# Patient Record
Sex: Female | Born: 1995 | Race: White | Hispanic: No | Marital: Single | Smoking: Former smoker
Health system: Southern US, Community
[De-identification: ages and names within clinical notes are randomized; demographics above are authoritative.]

## PROBLEM LIST (undated history)

## (undated) DIAGNOSIS — N83209 Unspecified ovarian cyst, unspecified side: Secondary | ICD-10-CM

## (undated) DIAGNOSIS — S72009A Fracture of unspecified part of neck of unspecified femur, initial encounter for closed fracture: Secondary | ICD-10-CM

---
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
## 2016-04-16 ENCOUNTER — Encounter: Payer: Self-pay | Admitting: Pediatrics

## 2016-04-17 ENCOUNTER — Encounter: Payer: Self-pay | Admitting: Pediatrics

## 2017-05-27 ENCOUNTER — Emergency Department (HOSPITAL_COMMUNITY)
Admission: EM | Admit: 2017-05-27 | Discharge: 2017-05-27 | Disposition: A | Discharge status: Home / Self Care | Payer: Self-pay | Attending: Emergency Medicine | Admitting: Emergency Medicine

## 2017-05-27 ENCOUNTER — Encounter (HOSPITAL_COMMUNITY): Payer: Self-pay | Admitting: Emergency Medicine

## 2017-05-27 DIAGNOSIS — J029 Acute pharyngitis, unspecified: Secondary | ICD-10-CM | POA: Insufficient documentation

## 2017-05-27 DIAGNOSIS — F17210 Nicotine dependence, cigarettes, uncomplicated: Secondary | ICD-10-CM | POA: Insufficient documentation

## 2017-05-27 LAB — CBC WITH DIFFERENTIAL/PLATELET
BASOS ABS: 0 10*3/uL (ref 0.0–0.1)
BASOS PCT: 1 %
EOS ABS: 0.2 10*3/uL (ref 0.0–0.7)
EOS PCT: 3 %
HCT: 35.6 % — ABNORMAL LOW (ref 36.0–46.0)
Hemoglobin: 12.1 g/dL (ref 12.0–15.0)
Lymphocytes Relative: 46 %
Lymphs Abs: 2.9 10*3/uL (ref 0.7–4.0)
MCH: 30.9 pg (ref 26.0–34.0)
MCHC: 34 g/dL (ref 30.0–36.0)
MCV: 90.8 fL (ref 78.0–100.0)
MONO ABS: 0.5 10*3/uL (ref 0.1–1.0)
MONOS PCT: 8 %
Neutro Abs: 2.6 10*3/uL (ref 1.7–7.7)
Neutrophils Relative %: 42 %
PLATELETS: 324 10*3/uL (ref 150–400)
RBC: 3.92 MIL/uL (ref 3.87–5.11)
RDW: 12.1 % (ref 11.5–15.5)
WBC: 6.3 10*3/uL (ref 4.0–10.5)

## 2017-05-27 LAB — MONONUCLEOSIS SCREEN: MONO SCREEN: NEGATIVE

## 2017-05-27 LAB — RAPID STREP SCREEN (MED CTR MEBANE ONLY): STREPTOCOCCUS, GROUP A SCREEN (DIRECT): NEGATIVE

## 2017-05-27 MED ORDER — AMOXICILLIN 500 MG PO CAPS: 500.0000 mg | ORAL_CAPSULE | Freq: Three times a day (TID) | ORAL | 0 refills | Status: DC

## 2017-05-27 MED ORDER — IBUPROFEN 600 MG PO TABS: 600.0000 mg | ORAL_TABLET | Freq: Four times a day (QID) | ORAL | 0 refills | Status: DC | PRN

## 2017-05-27 MED ORDER — ACETAMINOPHEN 325 MG PO TABS
650.0000 mg | ORAL_TABLET | Freq: Once | ORAL | Status: AC | PRN
  Administered 2017-05-27: 650 mg via ORAL
  Filled 2017-05-27: qty 2

## 2017-05-27 NOTE — ED Provider Notes (Signed)
[facility]-EMERGENCY DEPT Provider Note   CSN: [id number] Arrival date & time: 05/27/17  [id number]     History   Chief Complaint Chief Complaint  Patient presents with  . Sore Throat    HPI Christine Torres is a 21 y.o. female.  The history is provided by the patient. No language interpreter was used.  Sore Throat  This is a new problem. Episode onset: 6 weeks. The problem occurs constantly. The problem has not changed since onset.Pertinent negatives include no headaches. Nothing aggravates the symptoms. Nothing relieves the symptoms. She has tried nothing for the symptoms. The treatment provided no relief.  Pt has had a sore throat for over 3 weeks.  Pt also had similar about 6 weeks ago.    History reviewed. No pertinent past medical history.  Patient Active Problem List   Diagnosis Date Noted  . Well child check 06/24/2013  . Amenorrhea 06/24/2013  . Screening 06/24/2013    History reviewed. No pertinent surgical history.  OB History    No data available       Home Medications    Prior to Admission medications   Medication Sig Start Date End Date Taking? Authorizing Provider  amoxicillin (AMOXIL) 500 MG capsule Take 1 capsule (500 mg total) by mouth 3 (three) times daily. 05/27/17   Elson AreasSofia, Lakasha Mcfall K, PA-C  ibuprofen (ADVIL,MOTRIN) 600 MG tablet Take 1 tablet (600 mg total) by mouth every 6 (six) hours as needed. 05/27/17   Elson AreasSofia, Taneah Masri K, PA-C    Family History Family History  Problem Relation Age of Onset  . Mental illness Father   . Arthritis Maternal Grandfather   . Hypertension Paternal Grandmother   . Diabetes Paternal Grandmother   . Alcohol abuse Paternal Grandfather   . Asthma Neg Hx   . Birth defects Neg Hx   . Cancer Neg Hx   . COPD Neg Hx   . Depression Neg Hx   . Drug abuse Neg Hx   . Early death Neg Hx   . Hearing loss Neg Hx   . Heart disease Neg Hx   . Hyperlipidemia Neg Hx   . Kidney disease Neg Hx   . Learning disabilities Neg Hx   . Mental  retardation Neg Hx   . Miscarriages / Stillbirths Neg Hx   . Stroke Neg Hx   . Vision loss Neg Hx     Social History Social History  Substance Use Topics  . Smoking status: Current Every Day Smoker    Types: Cigarettes  . Smokeless tobacco: Never Used  . Alcohol use No     Allergies   Patient has no known allergies.   Review of Systems Review of Systems  Neurological: Negative for headaches.  All other systems reviewed and are negative.    Physical Exam Updated Vital Signs BP 107/67   Pulse 71   Temp 98 F (36.7 C) (Oral)   Resp 12   Ht 5\' 3"  (1.6 m)   Wt 54.4 kg (120 lb)   SpO2 100%   BMI 21.26 kg/m   Physical Exam  Constitutional: She appears well-developed and well-nourished.  HENT:  Head: Normocephalic.  Right Ear: External ear normal.  Left Ear: External ear normal.  Erythema pharynx.    Eyes: Pupils are equal, round, and reactive to light. EOM are normal.  Neck: Normal range of motion.  Cardiovascular: Normal rate.   Pulmonary/Chest: Effort normal.  Abdominal: Soft.  Musculoskeletal: Normal range of motion.  Neurological: She is  alert.  Skin: Skin is warm.  Psychiatric: She has a normal mood and affect.  Nursing note and vitals reviewed.    ED Treatments / Results  Labs (all labs ordered are listed, but only abnormal results are displayed) Labs Reviewed  CBC WITH DIFFERENTIAL/PLATELET - Abnormal; Notable for the following:       Result Value   HCT 35.6 (*)    All other components within normal limits  RAPID STREP SCREEN (NOT AT [facility])  CULTURE, GROUP A STREP [facility])  MONONUCLEOSIS SCREEN    EKG  EKG Interpretation None       Radiology No results found.  Procedures Procedures (including critical care time)  Medications Ordered in ED Medications  acetaminophen (TYLENOL) tablet 650 mg (650 mg Oral Given 05/27/17 0959)     Initial Impression / Assessment and Plan / ED Course  I have reviewed the triage vital signs and the  nursing notes.  Pertinent labs & imaging results that were available during my care of the patient were reviewed by me and considered in my medical decision making (see chart for details).     Current Meds  Medication Sig  . [DISCONTINUED] ibuprofen (ADVIL,MOTRIN) 200 MG tablet Take 400-600 mg by mouth every 6 (six) hours as needed for moderate pain.   An After Visit Summary was printed and given to the patient.   Final Clinical Impressions(s) / ED Diagnoses   Final diagnoses:  Pharyngitis, unspecified etiology    New Prescriptions New Prescriptions   AMOXICILLIN (AMOXIL) 500 MG CAPSULE    Take 1 capsule (500 mg total) by mouth 3 (three) times daily.   IBUPROFEN (ADVIL,MOTRIN) 600 MG TABLET    Take 1 tablet (600 mg total) by mouth every 6 (six) hours as needed.     Elson Areas, PA-C 05/27/17 1029    Shaune Pollack, MD 05/28/17 1122

## 2017-05-27 NOTE — ED Triage Notes (Signed)
Patient complaining of swollen lymph nodes in her throat. Patient went to md about three weeks ago and tested for strep. Patient did not have strep. Patient states now she has white bumps in the back of her throat.

## 2017-05-29 LAB — CULTURE, GROUP A STREP (THRC)

## 2017-06-16 ENCOUNTER — Encounter (HOSPITAL_COMMUNITY): Payer: Self-pay | Admitting: Emergency Medicine

## 2017-06-16 ENCOUNTER — Emergency Department (HOSPITAL_COMMUNITY)
Admission: EM | Admit: 2017-06-16 | Discharge: 2017-06-16 | Discharge status: Against Medical Advice | Payer: Self-pay | Attending: Emergency Medicine | Admitting: Emergency Medicine

## 2017-06-16 DIAGNOSIS — Z5321 Procedure and treatment not carried out due to patient leaving prior to being seen by health care provider: Secondary | ICD-10-CM | POA: Insufficient documentation

## 2017-06-16 LAB — COMPREHENSIVE METABOLIC PANEL
ALK PHOS: 78 U/L (ref 38–126)
ALT: 15 U/L (ref 14–54)
ANION GAP: 10 (ref 5–15)
AST: 20 U/L (ref 15–41)
Albumin: 4.7 g/dL (ref 3.5–5.0)
BILIRUBIN TOTAL: 1 mg/dL (ref 0.3–1.2)
BUN: 16 mg/dL (ref 6–20)
CALCIUM: 10 mg/dL (ref 8.9–10.3)
CO2: 25 mmol/L (ref 22–32)
Chloride: 105 mmol/L (ref 101–111)
Creatinine, Ser: 0.96 mg/dL (ref 0.44–1.00)
Glucose, Bld: 108 mg/dL — ABNORMAL HIGH (ref 65–99)
Potassium: 4.1 mmol/L (ref 3.5–5.1)
SODIUM: 140 mmol/L (ref 135–145)
TOTAL PROTEIN: 8.1 g/dL (ref 6.5–8.1)

## 2017-06-16 LAB — CBC
HEMATOCRIT: 39.2 % (ref 36.0–46.0)
HEMOGLOBIN: 13.4 g/dL (ref 12.0–15.0)
MCH: 30.5 pg (ref 26.0–34.0)
MCHC: 34.2 g/dL (ref 30.0–36.0)
MCV: 89.3 fL (ref 78.0–100.0)
Platelets: 313 10*3/uL (ref 150–400)
RBC: 4.39 MIL/uL (ref 3.87–5.11)
RDW: 11.9 % (ref 11.5–15.5)
WBC: 11.3 10*3/uL — ABNORMAL HIGH (ref 4.0–10.5)

## 2017-06-16 LAB — LIPASE, BLOOD: LIPASE: 19 U/L (ref 11–51)

## 2017-06-16 MED ORDER — MORPHINE SULFATE (PF) 4 MG/ML IV SOLN: 4.0000 mg | Freq: Once | INTRAVENOUS | Status: DC

## 2017-06-16 MED ORDER — ONDANSETRON 4 MG PO TBDP
4.0000 mg | ORAL_TABLET | Freq: Once | ORAL | Status: AC | PRN
  Administered 2017-06-16: 4 mg via ORAL
  Filled 2017-06-16: qty 1

## 2017-06-16 NOTE — ED Triage Notes (Signed)
Patient c/o sore throat that feels swollen x 2 months and bilat ear pain x month. Last night patient started having vomiting but denies diarrhea. Patient reports being seen here and tested for MOno and other things but all came back negative.

## 2017-09-02 ENCOUNTER — Emergency Department (HOSPITAL_COMMUNITY)
Admission: EM | Admit: 2017-09-02 | Discharge: 2017-09-02 | Disposition: A | Discharge status: Home / Self Care | Payer: Self-pay | Attending: Physician Assistant | Admitting: Physician Assistant

## 2017-09-02 ENCOUNTER — Encounter (HOSPITAL_COMMUNITY): Payer: Self-pay | Admitting: *Deleted

## 2017-09-02 ENCOUNTER — Emergency Department (HOSPITAL_COMMUNITY): Payer: Self-pay

## 2017-09-02 DIAGNOSIS — R102 Pelvic and perineal pain: Secondary | ICD-10-CM | POA: Insufficient documentation

## 2017-09-02 DIAGNOSIS — M25551 Pain in right hip: Secondary | ICD-10-CM | POA: Insufficient documentation

## 2017-09-02 DIAGNOSIS — F17210 Nicotine dependence, cigarettes, uncomplicated: Secondary | ICD-10-CM | POA: Insufficient documentation

## 2017-09-02 DIAGNOSIS — M25552 Pain in left hip: Secondary | ICD-10-CM | POA: Insufficient documentation

## 2017-09-02 HISTORY — DX: Fracture of unspecified part of neck of unspecified femur, initial encounter for closed fracture: S72.009A

## 2017-09-02 LAB — COMPREHENSIVE METABOLIC PANEL
ALBUMIN: 4.7 g/dL (ref 3.5–5.0)
ALT: 18 U/L (ref 14–54)
ANION GAP: 9 (ref 5–15)
AST: 37 U/L (ref 15–41)
Alkaline Phosphatase: 71 U/L (ref 38–126)
BILIRUBIN TOTAL: 1.5 mg/dL — AB (ref 0.3–1.2)
BUN: 9 mg/dL (ref 6–20)
CHLORIDE: 104 mmol/L (ref 101–111)
CO2: 26 mmol/L (ref 22–32)
Calcium: 9.6 mg/dL (ref 8.9–10.3)
Creatinine, Ser: 0.89 mg/dL (ref 0.44–1.00)
GFR calc Af Amer: >60 mL/min (ref >60)
GFR calc non Af Amer: >60 mL/min (ref >60)
GLUCOSE: 85 mg/dL (ref 65–99)
Potassium: 4.1 mmol/L (ref 3.5–5.1)
SODIUM: 139 mmol/L (ref 135–145)
TOTAL PROTEIN: 8.5 g/dL — AB (ref 6.5–8.1)

## 2017-09-02 LAB — CBC WITH DIFFERENTIAL/PLATELET
BASOS ABS: 0.1 10*3/uL (ref 0.0–0.1)
Basophils Relative: 1 %
Eosinophils Absolute: 0.1 10*3/uL (ref 0.0–0.7)
Eosinophils Relative: 1 %
HEMATOCRIT: 40.3 % (ref 36.0–46.0)
Hemoglobin: 14 g/dL (ref 12.0–15.0)
LYMPHS ABS: 1.9 10*3/uL (ref 0.7–4.0)
LYMPHS PCT: 21 %
MCH: 31.8 pg (ref 26.0–34.0)
MCHC: 34.7 g/dL (ref 30.0–36.0)
MCV: 91.6 fL (ref 78.0–100.0)
MONO ABS: 0.5 10*3/uL (ref 0.1–1.0)
MONOS PCT: 6 %
NEUTROS ABS: 6.7 10*3/uL (ref 1.7–7.7)
Neutrophils Relative %: 71 %
PLATELETS: 321 10*3/uL (ref 150–400)
RBC: 4.4 MIL/uL (ref 3.87–5.11)
RDW: 11.8 % (ref 11.5–15.5)
WBC: 9.3 10*3/uL (ref 4.0–10.5)

## 2017-09-02 LAB — MONONUCLEOSIS SCREEN: MONO SCREEN: NEGATIVE

## 2017-09-02 LAB — HCG, SERUM, QUALITATIVE: Preg, Serum: NEGATIVE

## 2017-09-02 MED ORDER — SODIUM CHLORIDE 0.9 % IV BOLUS (SEPSIS)
1000.0000 mL | Freq: Once | INTRAVENOUS | Status: AC
  Administered 2017-09-02: 1000 mL via INTRAVENOUS

## 2017-09-02 MED ORDER — ACETAMINOPHEN 325 MG PO TABS
650.0000 mg | ORAL_TABLET | Freq: Once | ORAL | Status: AC
  Administered 2017-09-02: 650 mg via ORAL
  Filled 2017-09-02: qty 2

## 2017-09-02 NOTE — ED Triage Notes (Signed)
Pt complains of bilateral hip pain and back pain, generalized body aches for the past 2 days. Pt states the pain started after exercising. Pt has hx of bilateral hip fractures. Pt states she feels like she has had fever for past 24 hours.

## 2017-09-02 NOTE — ED Provider Notes (Signed)
[facility]-EMERGENCY DEPT Provider Note   CSN: [id number] Arrival date & time: 09/02/17  [id number]     History   Chief Complaint Chief Complaint  Patient presents with  . Hip Pain  . Back Pain    HPI Christine Torres is a 21 y.o. female.  HPI   Patient is a 21 year old female presenting today with multiple complaints.  She reports that she has had bilateral hip pain since doing intensive exercise 3-4 days ago.  She reports that she was initially in the [employer] several years ago and had a pelvic fracture.  She was discharged.  However she reports she was exercising in both of her hip started hurting.  She reports that last night she developed a fever.  She is got no focal complaints except feelings of fatigue and achiness in her hips and into her back.  Specifically she denies any shortness of breath, cough, nasal congestion, urinary complaints, dyspareunia, vaginal discharge.  Past Medical History:  Diagnosis Date  . Hip fracture [facility](HCC)     Patient Active Problem List   Diagnosis Date Noted  . Well child check 06/24/2013  . Amenorrhea 06/24/2013  . Screening 06/24/2013    History reviewed. No pertinent surgical history.  OB History    No data available       Home Medications    Prior to Admission medications   Medication Sig Start Date End Date Taking? Authorizing Provider  amoxicillin (AMOXIL) 500 MG capsule Take 1 capsule (500 mg total) by mouth 3 (three) times daily. Patient not taking: Reported on 09/02/2017 05/27/17   Elson AreasSofia, Leslie K, PA-C  ibuprofen (ADVIL,MOTRIN) 600 MG tablet Take 1 tablet (600 mg total) by mouth every 6 (six) hours as needed. Patient not taking: Reported on 09/02/2017 05/27/17   Osie CheeksSofia, Leslie K, PA-C    Family History Family History  Problem Relation Age of Onset  . Mental illness Father   . Arthritis Maternal Grandfather   . Hypertension Paternal Grandmother   . Diabetes Paternal Grandmother   . Alcohol abuse Paternal  Grandfather   . Asthma Neg Hx   . Birth defects Neg Hx   . Cancer Neg Hx   . COPD Neg Hx   . Depression Neg Hx   . Drug abuse Neg Hx   . Early death Neg Hx   . Hearing loss Neg Hx   . Heart disease Neg Hx   . Hyperlipidemia Neg Hx   . Kidney disease Neg Hx   . Learning disabilities Neg Hx   . Mental retardation Neg Hx   . Miscarriages / Stillbirths Neg Hx   . Stroke Neg Hx   . Vision loss Neg Hx     Social History Social History   Tobacco Use  . Smoking status: Current Every Day Smoker    Types: Cigarettes  . Smokeless tobacco: Never Used  Substance Use Topics  . Alcohol use: No  . Drug use: No     Allergies   Patient has no known allergies.   Review of Systems Review of Systems  Constitutional: Positive for fatigue and fever. Negative for activity change.  HENT: Negative for congestion and ear pain.   Respiratory: Negative for shortness of breath.   Cardiovascular: Negative for chest pain.  Gastrointestinal: Negative for abdominal pain.  Musculoskeletal: Positive for arthralgias and myalgias. Negative for joint swelling, neck pain and neck stiffness.  Skin: Negative for pallor.     Physical Exam Updated Vital Signs BP  121/78 (BP Location: Right Arm)   Pulse (!) 105   Temp 100.1 F (37.8 C) (Oral)   Resp 18   SpO2 98%   Physical Exam  Constitutional: She is oriented to person, place, and time. She appears well-developed and well-nourished.  HENT:  Head: Normocephalic and atraumatic.  Eyes: Right eye exhibits no discharge. Left eye exhibits no discharge.  Cardiovascular: Normal rate and regular rhythm.  Pulmonary/Chest: Effort normal and breath sounds normal. No respiratory distress.  Abdominal: Soft. She exhibits no distension. There is no tenderness.  Genitourinary:  Genitourinary Comments: Painful lymphadenopathy bilaterally groin.  Musculoskeletal:  No pain with rotation of bilateral lower extremities.  Full range of motion at hip and knee.  No  evidence of erythema, or external warmth bilaterally.  Neurological: She is oriented to person, place, and time.  Skin: Skin is warm and dry. She is not diaphoretic.  Psychiatric: She has a normal mood and affect.  Nursing note and vitals reviewed.    ED Treatments / Results  Labs (all labs ordered are listed, but only abnormal results are displayed) Labs Reviewed  COMPREHENSIVE METABOLIC PANEL - Abnormal; Notable for the following components:      Result Value   Total Protein 8.5 (*)    Total Bilirubin 1.5 (*)    All other components within normal limits  CBC WITH DIFFERENTIAL/PLATELET  HCG, SERUM, QUALITATIVE  MONONUCLEOSIS SCREEN  URINALYSIS, ROUTINE W REFLEX MICROSCOPIC    EKG  EKG Interpretation None       Radiology Dg Pelvis 1-2 Views  Result Date: 09/02/2017 CLINICAL DATA:  Bilateral hip pain and back pain over the last 2 days. EXAM: PELVIS - 1-2 VIEW COMPARISON:  11/07/2015. FINDINGS: No focal bone finding. No traumatic finding. No evidence of sacroiliac or hip arthropathy. IMPRESSION: Normal radiograph. Electronically Signed   By: Paulina Fusi M.D.   On: 09/02/2017 09:46    Procedures Procedures (including critical care time)  Medications Ordered in ED Medications  sodium chloride 0.9 % bolus 1,000 mL (1,000 mLs Intravenous New Bag/Given 09/02/17 0827)  acetaminophen (TYLENOL) tablet 650 mg (650 mg Oral Given 09/02/17 1610)     Initial Impression / Assessment and Plan / ED Course  I have reviewed the triage vital signs and the nursing notes.  Pertinent labs & imaging results that were available during my care of the patient were reviewed by me and considered in my medical decision making (see chart for details).     Patient is a 21 year old female presenting today with multiple complaints.  She reports that she has had bilateral hip pain since doing intensive exercise 3-4 days ago.  She reports that she was initially in the [employer] several years ago and  had a pelvic fracture.  She was discharged.  However she reports she was exercising in both of her hip started hurting.  She reports that last night she developed a fever.  She is got no focal complaints except feelings of fatigue and achiness in her hips and into her back.  Specifically she denies any shortness of breath, cough, nasal congestion, urinary complaints, dyspareunia, vaginal discharge.  8:14 AM Think this is likely represents reactive lymphadenopathy.  Unsure why the exercise several days ago could be tied to this fever.  I do not think this represents any kind of infected joint given there is no erythema or pain with motion.  I think is likely two superimposed processes.  Will get labs, give fluids, treat fever.  10:16 AM Patient  is completely normal vital signs.  Normal labs.  No evidence of infection.  She adamantly denies any vaginal discharge, or vaginal complaints.  She has just mild cervical adenopathy.  With the low-grade fever.  No evidence of focal infection.  Just the fatigue after working on her bilateral hips.  Will have her follow-up with primary care physician. Told her to return with pain in abdomen, adnexa, vagina or other concenrs.   Final Clinical Impressions(s) / ED Diagnoses   Final diagnoses:  None    ED Discharge Orders    None       Zyana Amaro, Cindee Saltourteney Lyn, MD 09/02/17 1019

## 2017-09-02 NOTE — Discharge Instructions (Signed)
Use ibuprofen and Tylenol to help with your fever.  Please return with any complaints, specifically any vaginal complaints vaginal discharge pain in your abdomen or other concerns.

## 2017-09-03 ENCOUNTER — Encounter (HOSPITAL_COMMUNITY): Payer: Self-pay | Admitting: Emergency Medicine

## 2017-09-03 ENCOUNTER — Emergency Department (HOSPITAL_COMMUNITY)
Admission: EM | Admit: 2017-09-03 | Discharge: 2017-09-04 | Disposition: A | Discharge status: Home / Self Care | Payer: Self-pay | Attending: Emergency Medicine | Admitting: Emergency Medicine

## 2017-09-03 ENCOUNTER — Other Ambulatory Visit: Payer: Self-pay

## 2017-09-03 DIAGNOSIS — F17210 Nicotine dependence, cigarettes, uncomplicated: Secondary | ICD-10-CM | POA: Insufficient documentation

## 2017-09-03 DIAGNOSIS — I889 Nonspecific lymphadenitis, unspecified: Secondary | ICD-10-CM | POA: Insufficient documentation

## 2017-09-03 NOTE — ED Triage Notes (Signed)
Pt states that she has been having fever and groin pain/swelling x 2 days.  Pt states that she was seen yesterday but didn't get an answer and states that she doesn't feel better.

## 2017-09-04 LAB — URINALYSIS, ROUTINE W REFLEX MICROSCOPIC
Bilirubin Urine: NEGATIVE
GLUCOSE, UA: NEGATIVE mg/dL
HGB URINE DIPSTICK: NEGATIVE
KETONES UR: 20 mg/dL — AB
Leukocytes, UA: NEGATIVE
Nitrite: NEGATIVE
PROTEIN: NEGATIVE mg/dL
Specific Gravity, Urine: 1.036 — ABNORMAL HIGH (ref 1.005–1.030)
pH: 5 (ref 5.0–8.0)

## 2017-09-04 NOTE — ED Provider Notes (Signed)
[facility]-EMERGENCY DEPT Provider Note: Christine DellJ. Lane Sandria Mcenroe, MD, FACEP  CSN: [id number] MRN: [id number] ARRIVAL: 09/03/17 at 2152 ROOM: WA09/WA09   CHIEF COMPLAINT  Groin Pain   HISTORY OF PRESENT ILLNESS  09/04/17 5:10 AM Christine Torres is a 21 y.o. female with a 3-day history of bilateral inguinal lymphadenopathy accompanied by low-grade fever.  She initially attributed this pain to excessive exercise for 5 days ago but the pain is now localized to the lymph nodes and there is significant swelling and tenderness of the lymph nodes.  She rates her pain as a 10 out of 10, worse with standing and walking especially at work.  She denies any associated symptoms such as headache, chills, night sweats, cough, difficulty breathing, nausea, vomiting, diarrhea, vaginal discharge, vaginal bleeding, vaginal lesion or lesions, abdominal pain or dysuria.  She denies recent cat bite although she does own cats.  She previously served in [employer] and her immunizations are up-to-date.  She has noticed an urticarial rash of the groin folds and upper thighs which began in the last 24 hours.  It is not pruritic or painful.   Past Medical History:  Diagnosis Date  . Hip fracture (HCC)     No past surgical history on file.  Family History  Problem Relation Age of Onset  . Mental illness Father   . Arthritis Maternal Grandfather   . Hypertension Paternal Grandmother   . Diabetes Paternal Grandmother   . Alcohol abuse Paternal Grandfather   . Asthma Neg Hx   . Birth defects Neg Hx   . Cancer Neg Hx   . COPD Neg Hx   . Depression Neg Hx   . Drug abuse Neg Hx   . Early death Neg Hx   . Hearing loss Neg Hx   . Heart disease Neg Hx   . Hyperlipidemia Neg Hx   . Kidney disease Neg Hx   . Learning disabilities Neg Hx   . Mental retardation Neg Hx   . Miscarriages / Stillbirths Neg Hx   . Stroke Neg Hx   . Vision loss Neg Hx     Social History   Tobacco Use  . Smoking status: Current Every Day  Smoker    Types: Cigarettes  . Smokeless tobacco: Never Used  Substance Use Topics  . Alcohol use: No  . Drug use: No    Prior to Admission medications   Medication Sig Start Date End Date Taking? Authorizing Provider  acetaminophen (TYLENOL) 500 MG tablet Take 1,000 mg by mouth every 6 (six) hours as needed for mild pain.   Yes [provider]    Allergies Patient has no known allergies.   REVIEW OF SYSTEMS  Negative except as noted here or in the History of Present Illness.   PHYSICAL EXAMINATION  Initial Vital Signs Blood pressure 109/78, pulse 84, temperature 98.1 F (36.7 C), temperature source Oral, resp. rate 13, weight 54.4 kg (120 lb), SpO2 100 %.  Examination General: Well-developed, well-nourished female in no acute distress; appearance consistent with age of record HENT: normocephalic; atraumatic Eyes: pupils equal, round and reactive to light; extraocular muscles intact Neck: supple; no lymphadenopathy Heart: regular rate and rhythm Lungs: clear to auscultation bilaterally Abdomen: soft; nondistended; nontender; no masses or hepatosplenomegaly; bowel sounds present; tender bilateral enlarged inguinal lymph nodes Extremities: No deformity; full range of motion; pulses normal Neurologic: Awake, alert and oriented; motor function intact in all extremities and symmetric; no facial droop Skin: Warm and dry; urticarial rash  of bilateral groins Psychiatric: Normal mood and affect   RESULTS  Summary of this visit's results, reviewed by myself:   EKG Interpretation  Date/Time:    Ventricular Rate:    PR Interval:    QRS Duration:   QT Interval:    QTC Calculation:   R Axis:     Text Interpretation:        Laboratory Studies: Results for orders placed or performed during the hospital encounter of 09/03/17 (from the past 24 hour(s))  Urinalysis, Routine w reflex microscopic     Status: Abnormal   Collection Time: 09/04/17  2:26 AM  Result Value  Ref Range   Color, Urine YELLOW YELLOW   APPearance CLEAR CLEAR   Specific Gravity, Urine 1.036 (H) 1.005 - 1.030   pH 5.0 5.0 - 8.0   Glucose, UA NEGATIVE NEGATIVE mg/dL   Hgb urine dipstick NEGATIVE NEGATIVE   Bilirubin Urine NEGATIVE NEGATIVE   Ketones, ur 20 (A) NEGATIVE mg/dL   Protein, ur NEGATIVE NEGATIVE mg/dL   Nitrite NEGATIVE NEGATIVE   Leukocytes, UA NEGATIVE NEGATIVE   Imaging Studies: Dg Pelvis 1-2 Views  Result Date: 09/02/2017 CLINICAL DATA:  Bilateral hip pain and back pain over the last 2 days. EXAM: PELVIS - 1-2 VIEW COMPARISON:  11/07/2015. FINDINGS: No focal bone finding. No traumatic finding. No evidence of sacroiliac or hip arthropathy. IMPRESSION: Normal radiograph. Electronically Signed   By: Paulina FusiMark  Shogry M.D.   On: 09/02/2017 09:46    ED COURSE  Nursing notes and initial vitals signs, including pulse oximetry, reviewed.  Vitals:   09/03/17 2203 09/04/17 0153 09/04/17 0430  BP: 113/75 (!) 89/64 109/78  Pulse: (!) 124 77 84  Resp: 20 19 13   Temp: 99.1 F (37.3 C) 97.7 F (36.5 C) 98.1 F (36.7 C)  TempSrc: Oral Oral Oral  SpO2: 100% 99% 100%  Weight: 54.4 kg (120 lb)     5:49 AM The patient's diagnostic studies from 2 days ago and this visit were reviewed.  Epocrates was consulted and an exhausting differential diagnosis entertained.  The patient's symptomatology and recent onset of symptoms are not consistent with any particular etiology apart from acute viral illness.  Malignancy is unlikely due to the rapid onset of painful lymph nodes.  We will refer her to the regional center for infectious diseases if symptoms do not improve over the next 2 weeks.  She declined HIV testing.  PROCEDURES    ED DIAGNOSES     ICD-10-CM   1. Inguinal lymphadenitis I88.9        [name], Jonny RuizJohn, MD 09/04/17 [phone]

## 2017-10-03 ENCOUNTER — Emergency Department (HOSPITAL_COMMUNITY)
Admission: EM | Admit: 2017-10-03 | Discharge: 2017-10-04 | Disposition: A | Discharge status: Home / Self Care | Payer: Self-pay | Attending: Emergency Medicine | Admitting: Emergency Medicine

## 2017-10-03 ENCOUNTER — Encounter (HOSPITAL_COMMUNITY): Payer: Self-pay

## 2017-10-03 DIAGNOSIS — F17210 Nicotine dependence, cigarettes, uncomplicated: Secondary | ICD-10-CM | POA: Insufficient documentation

## 2017-10-03 DIAGNOSIS — R1031 Right lower quadrant pain: Secondary | ICD-10-CM | POA: Insufficient documentation

## 2017-10-03 DIAGNOSIS — R1030 Lower abdominal pain, unspecified: Secondary | ICD-10-CM | POA: Insufficient documentation

## 2017-10-03 DIAGNOSIS — Z8742 Personal history of other diseases of the female genital tract: Secondary | ICD-10-CM | POA: Insufficient documentation

## 2017-10-03 DIAGNOSIS — R1032 Left lower quadrant pain: Secondary | ICD-10-CM | POA: Insufficient documentation

## 2017-10-03 LAB — CBC WITH DIFFERENTIAL/PLATELET
Basophils Absolute: 0.1 10*3/uL (ref 0.0–0.1)
Basophils Relative: 1 %
EOS PCT: 7 %
Eosinophils Absolute: 0.5 10*3/uL (ref 0.0–0.7)
HEMATOCRIT: 39.1 % (ref 36.0–46.0)
Hemoglobin: 13.3 g/dL (ref 12.0–15.0)
LYMPHS PCT: 41 %
Lymphs Abs: 2.8 10*3/uL (ref 0.7–4.0)
MCH: 31.1 pg (ref 26.0–34.0)
MCHC: 34 g/dL (ref 30.0–36.0)
MCV: 91.6 fL (ref 78.0–100.0)
MONO ABS: 0.6 10*3/uL (ref 0.1–1.0)
MONOS PCT: 9 %
NEUTROS ABS: 2.8 10*3/uL (ref 1.7–7.7)
Neutrophils Relative %: 42 %
Platelets: 351 10*3/uL (ref 150–400)
RBC: 4.27 MIL/uL (ref 3.87–5.11)
RDW: 12.4 % (ref 11.5–15.5)
WBC: 6.7 10*3/uL (ref 4.0–10.5)

## 2017-10-03 LAB — URINALYSIS, ROUTINE W REFLEX MICROSCOPIC
BILIRUBIN URINE: NEGATIVE
GLUCOSE, UA: NEGATIVE mg/dL
HGB URINE DIPSTICK: NEGATIVE
KETONES UR: NEGATIVE mg/dL
Leukocytes, UA: NEGATIVE
Nitrite: NEGATIVE
Protein, ur: NEGATIVE mg/dL
Specific Gravity, Urine: 1.019 (ref 1.005–1.030)
pH: 7 (ref 5.0–8.0)

## 2017-10-03 LAB — I-STAT BETA HCG BLOOD, ED (MC, WL, AP ONLY): I-stat hCG, quantitative: <5 m[IU]/mL (ref <5)

## 2017-10-03 LAB — I-STAT CG4 LACTIC ACID, ED: Lactic Acid, Venous: 0.83 mmol/L (ref 0.5–1.9)

## 2017-10-03 MED ORDER — SODIUM CHLORIDE 0.9 % IV BOLUS (SEPSIS)
1000.0000 mL | Freq: Once | INTRAVENOUS | Status: AC
  Administered 2017-10-03: 1000 mL via INTRAVENOUS

## 2017-10-03 MED ORDER — MORPHINE SULFATE (PF) 4 MG/ML IV SOLN
4.0000 mg | Freq: Once | INTRAVENOUS | Status: AC
  Administered 2017-10-03: 4 mg via INTRAVENOUS
  Filled 2017-10-03: qty 1

## 2017-10-03 NOTE — ED Triage Notes (Signed)
Pt complains of abdominal pain that thinks may be her ovarian cysts, pt states that pain started about 9

## 2017-10-03 NOTE — ED Provider Notes (Signed)
[facility]-EMERGENCY DEPT Provider Note   CSN: [id number] Arrival date & time: 10/03/17  2217     History    Chief Complaint Chief Complaint  Patient presents with  . Abdominal Pain    HPI Christine Torres is a 22 y.o. female with ovarian cysts who presents for evaluation of abdominal pain that began yesterday.  Patient reports that initially, pain was intermittent but states that over the last day, patient has become more severe and more frequent.  She describes it as a "sharp, stabbing".  States that the pain started in the lower abdomen and went to the left lower quadrant and now has gone to the right lower quadrant.  She has been eating and drinking without any difficulty.  She denies any nausea or vomiting.  She denies fever chills.  She does state that she does have a history of ovarian cyst on the left side.  Her last LMP was approximate 4-5 months ago.  Patient is currently sexually active with her husband.  They do not use protection.  Patient denies any fever, chills, chest pain, difficulty breathing, dysuria, hematuria, nausea/vomiting.  The history is provided by the patient.    Past Medical History:  Diagnosis Date  . Hip fracture [facility])     Patient Active Problem List   Diagnosis Date Noted  . Well child check 06/24/2013  . Amenorrhea 06/24/2013  . Screening 06/24/2013    History reviewed. No pertinent surgical history.  OB History    No data available       Home Medications    Prior to Admission medications   Medication Sig Start Date End Date Taking? Authorizing Provider  acetaminophen (TYLENOL) 500 MG tablet Take 1,000 mg by mouth every 6 (six) hours as needed for mild pain.    [provider]  dicyclomine (BENTYL) 20 MG tablet Take 1 tablet (20 mg total) by mouth 2 (two) times daily. 10/04/17   Garlon Hatchet, PA-C  ondansetron (ZOFRAN ODT) 4 MG disintegrating tablet Take 1 tablet (4 mg total) by mouth every 8 (eight) hours  as needed for nausea. 10/04/17   Garlon Hatchet, PA-C    Family History Family History  Problem Relation Age of Onset  . Mental illness Father   . Arthritis Maternal Grandfather   . Hypertension Paternal Grandmother   . Diabetes Paternal Grandmother   . Alcohol abuse Paternal Grandfather   . Asthma Neg Hx   . Birth defects Neg Hx   . Cancer Neg Hx   . COPD Neg Hx   . Depression Neg Hx   . Drug abuse Neg Hx   . Early death Neg Hx   . Hearing loss Neg Hx   . Heart disease Neg Hx   . Hyperlipidemia Neg Hx   . Kidney disease Neg Hx   . Learning disabilities Neg Hx   . Mental retardation Neg Hx   . Miscarriages / Stillbirths Neg Hx   . Stroke Neg Hx   . Vision loss Neg Hx     Social History Social History   Tobacco Use  . Smoking status: Current Every Day Smoker    Types: Cigarettes  . Smokeless tobacco: Never Used  Substance Use Topics  . Alcohol use: No  . Drug use: No     Allergies   Patient has no known allergies.   Review of Systems Review of Systems  Constitutional: Negative for fever.  Respiratory: Negative for cough and shortness of  breath.   Cardiovascular: Negative for chest pain.  Gastrointestinal: Positive for abdominal pain. Negative for nausea and vomiting.  Genitourinary: Negative for dysuria, hematuria and vaginal bleeding.  Neurological: Negative for headaches.     Physical Exam Updated Vital Signs BP 112/76   Pulse 74   Temp (!) 97.5 F (36.4 C) (Oral)   Resp 18   Ht 5\' 3"  (1.6 m)   Wt 54.4 kg (120 lb)   SpO2 100%   BMI 21.26 kg/m   Physical Exam  Constitutional: She is oriented to person, place, and time. She appears well-developed and well-nourished.  Appears uncomfortable but no acute distress   HENT:  Head: Normocephalic and atraumatic.  Mouth/Throat: Oropharynx is clear and moist and mucous membranes are normal.  Eyes: Conjunctivae, EOM and lids are normal. Pupils are equal, round, and reactive to light.  Neck: Full  passive range of motion without pain.  Cardiovascular: Normal rate, regular rhythm, normal heart sounds and normal pulses. Exam reveals no gallop and no friction rub.  No murmur heard. Pulmonary/Chest: Effort normal and breath sounds normal.  Abdominal: Soft. Normal appearance and bowel sounds are normal. There is tenderness in the right lower quadrant, suprapubic area and left lower quadrant. There is tenderness at McBurney's point. There is no rigidity, no guarding, no CVA tenderness and negative Murphy's sign.  Abdomen soft, nondistended.  Patient does have tenderness noted to the lower abdomen, and most notably in the left lower quadrant right lower quadrant. Positive Rosving's. Negative rebounding. No rigidity, guarding.  No peritoneal signs.  Genitourinary: Vagina normal and uterus normal. Cervix exhibits no motion tenderness and no discharge. Right adnexum displays tenderness. Right adnexum displays no mass. Left adnexum displays no mass and no tenderness. No bleeding in the vagina. No vaginal discharge found.  Genitourinary Comments: The exam was performed with a chaperone present. Normal external female genitalia. No lesions, rash, or sores.  No discharge noted.  No bleeding.  No uterine no CMT, cervical friability.  Patient does have some right adnexal tenderness but no mass noted.  No left adnexal mass or tenderness noted.  Musculoskeletal: Normal range of motion.  Neurological: She is alert and oriented to person, place, and time.  Skin: Skin is warm and dry. Capillary refill takes less than 2 seconds.  Psychiatric: She has a normal mood and affect. Her speech is normal.  Nursing note and vitals reviewed.    ED Treatments / Results  Labs (all labs ordered are listed, but only abnormal results are displayed) Labs Reviewed  WET PREP, GENITAL - Abnormal; Notable for the following components:      Result Value   WBC, Wet Prep HPF POC FEW (*)    All other components within normal limits   COMPREHENSIVE METABOLIC PANEL - Abnormal; Notable for the following components:   Sodium 134 (*)    AST 46 (*)    All other components within normal limits  LIPASE, BLOOD  CBC WITH DIFFERENTIAL/PLATELET  URINALYSIS, ROUTINE W REFLEX MICROSCOPIC  I-STAT BETA HCG BLOOD, ED ([facility], [facility], [facility] ONLY)  I-STAT CG4 LACTIC ACID, ED  GC/CHLAMYDIA PROBE AMP ([place]) NOT AT [facility]    EKG  EKG Interpretation None       Radiology Ct Abdomen Pelvis W Contrast  Result Date: 10/04/2017 CLINICAL DATA:  Abdominal pain EXAM: CT ABDOMEN AND PELVIS WITH CONTRAST TECHNIQUE: Multidetector CT imaging of the abdomen and pelvis was performed using the standard protocol following bolus administration of intravenous contrast. CONTRAST:   ISOVUE-300 IOPAMIDOL (ISOVUE-300) INJECTION 61% COMPARISON:  CT abdomen pelvis 05/04/2016, 11/07/2015 FINDINGS: Lower chest: No acute abnormality. Hepatobiliary: No focal liver abnormality is seen. No gallstones, gallbladder wall thickening, or biliary dilatation. Pancreas: Unremarkable. No pancreatic ductal dilatation or surrounding inflammatory changes. Spleen: Normal in size without focal abnormality. Adrenals/Urinary Tract: Adrenal glands are unremarkable. Kidneys are normal, without renal calculi, focal lesion, or hydronephrosis. Bladder is unremarkable. Stomach/Bowel: Stomach is within normal limits. Appendix not well seen but no right lower quadrant inflammation is visualized. No evidence of bowel wall thickening, distention, or inflammatory changes. Prominent mucosal enhancement right lower quadrant small bowel loops. Vascular/Lymphatic: No significant vascular findings are present. No enlarged abdominal or pelvic lymph nodes. Reproductive: Uterus and bilateral adnexa are unremarkable. Other: No abdominal wall hernia or abnormality. No abdominopelvic ascites. Musculoskeletal: No acute or significant osseous findings. IMPRESSION: 1. Possible mild mucosal enhancement of right  lower quadrant small bowel loops, query mild enteritis of infectious or inflammatory etiology 2. Otherwise no CT evidence for acute intra-abdominal or pelvic abnormality Electronically Signed   By: Jasmine Pang M.D.   On: 10/04/2017 01:10    Procedures Procedures (including critical care time)  Medications Ordered in ED Medications  iopamidol (ISOVUE-300) 61 % injection (not administered)  sodium chloride 0.9 % bolus 1,000 mL (0 mLs Intravenous Stopped 10/04/17 0144)  morphine 4 MG/ML injection 4 mg (4 mg Intravenous Given 10/03/17 2351)  iopamidol (ISOVUE-300) 61 % injection 100 mL (100 mLs Intravenous Contrast Given 10/04/17 0029)     Initial Impression / Assessment and Plan / ED Course  I have reviewed the triage vital signs and the nursing notes.  Pertinent labs & imaging results that were available during my care of the patient were reviewed by me and considered in my medical decision making (see chart for details).     21 y.o. F who presents for evaluation of lower abdominal pain.  Patient reports that initially pain was diffuse but then localized to the left lower quadrant and is now gone to the right lower quadrant.  She states it is become more severe.  She does not take any medications for the pain.  No fever, nausea/vomiting.  No chest pain, no difficulty breathing. Patient is afebrile, non-toxic appearing, sitting comfortably on examination table. Vital signs reviewed and stable. Physical exam shows tenderness to the RLQ.  She does have some tenderness at McBurney's point.  No Murphy's sign.  Patient has some suprapubic abdominal pain also.  No peritoneal signs.  Consider pregnancy versus UTI is acute infectious etiology. History/physical exam are not concerning for ovarian torsion.  Plan for basic labs. Analgesics provided in the department.  Labs and imaging reviewed.  UA is negative for any acute signs of infection.  CMP shows slight bump in AST.  Records reviewed to this new.   CBC unremarkable.  Lipase unremarkable.  I-STAT beta is negative.  Pelvic exam as mentioned above.  Patient did have some right adnexal tenderness.  No mass noted.  No left adnexal mass or tenderness.  No CMT.  Pelvic exam is not concerning for PID.  Repeat abdominal exam shows patient is still tender in the right lower quadrant.  Given concerns, will obtain a CT abdomen pelvis for further evaluation.  Patient signed out ot Sharilyn Sites, PA-C with CT abd/pelvis pending. Plan for re-evaluation after CT. If patient still tender, consider U/S for further evaluation. [facility].    Final Clinical Impressions(s) / ED  Diagnoses   Final diagnoses:  Right lower quadrant abdominal pain    ED Discharge Orders        Ordered    dicyclomine (BENTYL) 20 MG tablet  2 times daily     10/04/17 0141    ondansetron (ZOFRAN ODT) 4 MG disintegrating tablet  Every 8 hours PRN     10/04/17 0141       Maxwell CaulLayden, Colbert Curenton A, PA-C 10/04/17 0247    Ward, Layla MawKristen N, DO 10/04/17 [phone]

## 2017-10-04 ENCOUNTER — Emergency Department (HOSPITAL_COMMUNITY): Payer: Self-pay

## 2017-10-04 ENCOUNTER — Encounter (HOSPITAL_COMMUNITY): Payer: Self-pay | Admitting: Radiology

## 2017-10-04 LAB — LIPASE, BLOOD: LIPASE: 26 U/L (ref 11–51)

## 2017-10-04 LAB — COMPREHENSIVE METABOLIC PANEL
ALT: 54 U/L (ref 14–54)
AST: 46 U/L — AB (ref 15–41)
Albumin: 3.8 g/dL (ref 3.5–5.0)
Alkaline Phosphatase: 76 U/L (ref 38–126)
Anion gap: 7 (ref 5–15)
BUN: 15 mg/dL (ref 6–20)
CO2: 24 mmol/L (ref 22–32)
Calcium: 9.3 mg/dL (ref 8.9–10.3)
Chloride: 103 mmol/L (ref 101–111)
Creatinine, Ser: 0.68 mg/dL (ref 0.44–1.00)
GFR calc Af Amer: >60 mL/min (ref >60)
Glucose, Bld: 89 mg/dL (ref 65–99)
POTASSIUM: 3.8 mmol/L (ref 3.5–5.1)
Sodium: 134 mmol/L — ABNORMAL LOW (ref 135–145)
TOTAL PROTEIN: 7.4 g/dL (ref 6.5–8.1)
Total Bilirubin: 0.3 mg/dL (ref 0.3–1.2)

## 2017-10-04 LAB — WET PREP, GENITAL
CLUE CELLS WET PREP: NONE SEEN
Sperm: NONE SEEN
Trich, Wet Prep: NONE SEEN
YEAST WET PREP: NONE SEEN

## 2017-10-04 MED ORDER — IOPAMIDOL (ISOVUE-300) INJECTION 61%
INTRAVENOUS | Status: AC
  Filled 2017-10-04: qty 100

## 2017-10-04 MED ORDER — ONDANSETRON 4 MG PO TBDP: 4.0000 mg | ORAL_TABLET | Freq: Three times a day (TID) | ORAL | 0 refills | Status: DC | PRN

## 2017-10-04 MED ORDER — IOPAMIDOL (ISOVUE-300) INJECTION 61%
100.0000 mL | Freq: Once | INTRAVENOUS | Status: AC | PRN
  Administered 2017-10-04: 100 mL via INTRAVENOUS

## 2017-10-04 MED ORDER — DICYCLOMINE HCL 20 MG PO TABS: 20.0000 mg | ORAL_TABLET | Freq: Two times a day (BID) | ORAL | 0 refills | Status: DC

## 2017-10-04 NOTE — ED Provider Notes (Signed)
Assumed care from PA Layden.  See prior note for full H&P.  Briefly, 22  Y.o. F here with lower abdominal pain.  Initially left lower, now right.  No N/V/D.  Has hx of ovarian cysts.  Pelvic exam done earlier, wet prep WNL.    Plan:  CT pending.  Results for orders placed or performed during the hospital encounter of 10/03/17  Wet prep, genital  Result Value Ref Range   Yeast Wet Prep HPF POC NONE SEEN NONE SEEN   Trich, Wet Prep NONE SEEN NONE SEEN   Clue Cells Wet Prep HPF POC NONE SEEN NONE SEEN   WBC, Wet Prep HPF POC FEW (A) NONE SEEN   Sperm NONE SEEN   Comprehensive metabolic panel  Result Value Ref Range   Sodium 134 (L) 135 - 145 mmol/L   Potassium 3.8 3.5 - 5.1 mmol/L   Chloride 103 101 - 111 mmol/L   CO2 24 22 - 32 mmol/L   Glucose, Bld 89 65 - 99 mg/dL   BUN 15 6 - 20 mg/dL   Creatinine, Ser 1.910.68 0.44 - 1.00 mg/dL   Calcium 9.3 8.9 - 47.810.3 mg/dL   Total Protein 7.4 6.5 - 8.1 g/dL   Albumin 3.8 3.5 - 5.0 g/dL   AST 46 (H) 15 - 41 U/L   ALT 54 14 - 54 U/L   Alkaline Phosphatase 76 38 - 126 U/L   Total Bilirubin 0.3 0.3 - 1.2 mg/dL   GFR calc non Af Amer >60 >60 mL/min   GFR calc Af Amer >60 >60 mL/min   Anion gap 7 5 - 15  Lipase, blood  Result Value Ref Range   Lipase 26 11 - 51 U/L  CBC with Differential  Result Value Ref Range   WBC 6.7 4.0 - 10.5 K/uL   RBC 4.27 3.87 - 5.11 MIL/uL   Hemoglobin 13.3 12.0 - 15.0 g/dL   HCT 29.539.1 62.136.0 - 30.846.0 %   MCV 91.6 78.0 - 100.0 fL   MCH 31.1 26.0 - 34.0 pg   MCHC 34.0 30.0 - 36.0 g/dL   RDW 65.712.4 84.611.5 - 96.215.5 %   Platelets 351 150 - 400 K/uL   Neutrophils Relative % 42 %   Neutro Abs 2.8 1.7 - 7.7 K/uL   Lymphocytes Relative 41 %   Lymphs Abs 2.8 0.7 - 4.0 K/uL   Monocytes Relative 9 %   Monocytes Absolute 0.6 0.1 - 1.0 K/uL   Eosinophils Relative 7 %   Eosinophils Absolute 0.5 0.0 - 0.7 K/uL   Basophils Relative 1 %   Basophils Absolute 0.1 0.0 - 0.1 K/uL  Urinalysis, Routine w reflex microscopic  Result Value Ref  Range   Color, Urine YELLOW YELLOW   APPearance CLEAR CLEAR   Specific Gravity, Urine 1.019 1.005 - 1.030   pH 7.0 5.0 - 8.0   Glucose, UA NEGATIVE NEGATIVE mg/dL   Hgb urine dipstick NEGATIVE NEGATIVE   Bilirubin Urine NEGATIVE NEGATIVE   Ketones, ur NEGATIVE NEGATIVE mg/dL   Protein, ur NEGATIVE NEGATIVE mg/dL   Nitrite NEGATIVE NEGATIVE   Leukocytes, UA NEGATIVE NEGATIVE  I-Stat Beta hCG blood, ED ([facility], [facility], [facility] only)  Result Value Ref Range   I-stat hCG, quantitative <5.0 <5 mIU/mL   Comment 3          I-Stat CG4 Lactic Acid, ED  Result Value Ref Range   Lactic Acid, Venous 0.83 0.5 - 1.9 mmol/L    1:35 AM CT with mucosal  enhancement of right lower bowel loops, question of enteritis (inflammatory vs infectious).  Appendix was not well visualized but no right lower quadrant inflammation. Ovarian are unremarkable on CT. Suspect this pain due to inflammatory enteritis-- no WBC count, fever, or other symptoms to suggest infectious nature.  I do not feel she needs abx at this time.  Patient feels reassured, states she was concerned for appendicitis.  Will discharge home with symptomatic care.  She understands to return here for recheck for any new/acute changes.   Garlon Hatchet, PA-C 10/04/17 0246    Ward, Layla Maw, DO 10/04/17 [phone]

## 2017-10-04 NOTE — Discharge Instructions (Signed)
Take the prescribed medication as directed.  Recommend gentle diet, lots of fluids. Follow-up with your primary care doctor if any new/acute changes. Return to the ED for new or worsening symptoms.

## 2017-10-05 LAB — GC/CHLAMYDIA PROBE AMP (~~LOC~~) NOT AT ARMC
Chlamydia: NEGATIVE
Neisseria Gonorrhea: NEGATIVE

## 2017-12-03 DIAGNOSIS — M549 Dorsalgia, unspecified: Secondary | ICD-10-CM | POA: Insufficient documentation

## 2017-12-03 DIAGNOSIS — F17210 Nicotine dependence, cigarettes, uncomplicated: Secondary | ICD-10-CM | POA: Insufficient documentation

## 2017-12-03 DIAGNOSIS — Z5321 Procedure and treatment not carried out due to patient leaving prior to being seen by health care provider: Secondary | ICD-10-CM | POA: Insufficient documentation

## 2017-12-03 DIAGNOSIS — R0600 Dyspnea, unspecified: Secondary | ICD-10-CM | POA: Insufficient documentation

## 2017-12-03 DIAGNOSIS — R0789 Other chest pain: Secondary | ICD-10-CM | POA: Insufficient documentation

## 2017-12-04 ENCOUNTER — Other Ambulatory Visit: Payer: Self-pay

## 2017-12-04 ENCOUNTER — Emergency Department (HOSPITAL_COMMUNITY): Payer: Self-pay

## 2017-12-04 ENCOUNTER — Encounter (HOSPITAL_COMMUNITY): Payer: Self-pay | Admitting: Emergency Medicine

## 2017-12-04 ENCOUNTER — Emergency Department (HOSPITAL_COMMUNITY)
Admission: EM | Admit: 2017-12-04 | Discharge: 2017-12-04 | Disposition: A | Discharge status: Home / Self Care | Payer: Self-pay | Attending: Emergency Medicine | Admitting: Emergency Medicine

## 2017-12-04 NOTE — ED Triage Notes (Signed)
Pt states she is a smoker and has smoked for the past 3-4 years  States she has been having difficulty breathing and pain in her ribs and back for a while now  Pt requesting a chest xray

## 2017-12-04 NOTE — ED Notes (Signed)
Registration clerk reports pt left  

## 2018-08-04 ENCOUNTER — Ambulatory Visit (HOSPITAL_COMMUNITY)
Admission: EM | Admit: 2018-08-04 | Discharge: 2018-08-04 | Disposition: A | Discharge status: Home / Self Care | Payer: Self-pay | Attending: Family Medicine | Admitting: Family Medicine

## 2018-08-04 ENCOUNTER — Other Ambulatory Visit: Payer: Self-pay

## 2018-08-04 ENCOUNTER — Encounter (HOSPITAL_COMMUNITY): Payer: Self-pay

## 2018-08-04 DIAGNOSIS — Z8249 Family history of ischemic heart disease and other diseases of the circulatory system: Secondary | ICD-10-CM | POA: Insufficient documentation

## 2018-08-04 DIAGNOSIS — J028 Acute pharyngitis due to other specified organisms: Secondary | ICD-10-CM | POA: Insufficient documentation

## 2018-08-04 DIAGNOSIS — Z79899 Other long term (current) drug therapy: Secondary | ICD-10-CM | POA: Insufficient documentation

## 2018-08-04 DIAGNOSIS — J029 Acute pharyngitis, unspecified: Secondary | ICD-10-CM

## 2018-08-04 DIAGNOSIS — F17210 Nicotine dependence, cigarettes, uncomplicated: Secondary | ICD-10-CM | POA: Insufficient documentation

## 2018-08-04 LAB — POCT RAPID STREP A: Streptococcus, Group A Screen (Direct): NEGATIVE

## 2018-08-04 MED ORDER — DEXAMETHASONE SODIUM PHOSPHATE 10 MG/ML IJ SOLN
INTRAMUSCULAR | Status: AC
  Filled 2018-08-04: qty 1

## 2018-08-04 MED ORDER — DEXAMETHASONE SODIUM PHOSPHATE 10 MG/ML IJ SOLN
10.0000 mg | Freq: Once | INTRAMUSCULAR | Status: AC
  Administered 2018-08-04: 10 mg via INTRAMUSCULAR

## 2018-08-04 NOTE — Discharge Instructions (Signed)
Your strep swab was negative.  We will send for culture Steroid injection given in clinic to help with pain,  inflammation and swelling of the throat Follow up as needed for continued or worsening symptoms

## 2018-08-04 NOTE — ED Triage Notes (Signed)
Pt c/o sore throat and ear pain ( both ). 4 days.

## 2018-08-04 NOTE — ED Provider Notes (Signed)
[facility]    CSN: [id number] Arrival date & time: 08/04/18  [id number]     History   Chief Complaint Chief Complaint  Patient presents with  . Sore Throat  . Otalgia    HPI Christine Torres is a 22 y.o. female.   Patient is a 22 year old female presents with sore throat and bilateral ear pain and fullness x4 days.  Her symptoms have been constant worsening.  She woke up this morning with swollen tonsils and exudates.  Reports low-grade fever a few days ago.  She has been taking ibuprofen with some relief of symptoms.  She denies any associated URI symptoms.  She has had mild chills and body aches.  Reports history of strep.  ROS per HPI      Past Medical History:  Diagnosis Date  . Hip fracture [facility](HCC)     Patient Active Problem List   Diagnosis Date Noted  . Well child check 06/24/2013  . Amenorrhea 06/24/2013  . Screening 06/24/2013    History reviewed. No pertinent surgical history.  OB History   None      Home Medications    Prior to Admission medications   Medication Sig Start Date End Date Taking? Authorizing Provider  acetaminophen (TYLENOL) 500 MG tablet Take 1,000 mg by mouth every 6 (six) hours as needed for mild pain.    [provider]  dicyclomine (BENTYL) 20 MG tablet Take 1 tablet (20 mg total) by mouth 2 (two) times daily. 10/04/17   Garlon HatchetSanders, Lisa M, PA-C  ondansetron (ZOFRAN ODT) 4 MG disintegrating tablet Take 1 tablet (4 mg total) by mouth every 8 (eight) hours as needed for nausea. 10/04/17   Garlon HatchetSanders, Lisa M, PA-C    Family History Family History  Problem Relation Age of Onset  . Mental illness Father   . Arthritis Maternal Grandfather   . Hypertension Paternal Grandmother   . Diabetes Paternal Grandmother   . Alcohol abuse Paternal Grandfather   . Asthma Neg Hx   . Birth defects Neg Hx   . Cancer Neg Hx   . COPD Neg Hx   . Depression Neg Hx   . Drug abuse Neg Hx   . Early death Neg Hx   . Hearing loss Neg Hx   .  Heart disease Neg Hx   . Hyperlipidemia Neg Hx   . Kidney disease Neg Hx   . Learning disabilities Neg Hx   . Mental retardation Neg Hx   . Miscarriages / Stillbirths Neg Hx   . Stroke Neg Hx   . Vision loss Neg Hx     Social History Social History   Tobacco Use  . Smoking status: Current Every Day Smoker    Types: Cigarettes  . Smokeless tobacco: Never Used  Substance Use Topics  . Alcohol use: Yes    Comment: occ  . Drug use: No     Allergies   Patient has no known allergies.   Review of Systems Review of Systems   Physical Exam Triage Vital Signs ED Triage Vitals  Enc Vitals Group     BP 08/04/18 0930 96/60     Pulse Rate 08/04/18 0930 68     Resp 08/04/18 0930 18     Temp 08/04/18 0930 98.2 F (36.8 C)     Temp Source 08/04/18 0930 Oral     SpO2 08/04/18 0930 100 %     Weight 08/04/18 0928 115 lb (52.2 kg)     Height --  Head Circumference --      Peak Flow --      Pain Score 08/04/18 0928 6     Pain Loc --      Pain Edu? --      Excl. in GC? --    No data found.  Updated Vital Signs BP 96/60 (BP Location: Right Arm)   Pulse 68   Temp 98.2 F (36.8 C) (Oral)   Resp 18   Wt 115 lb (52.2 kg)   SpO2 100%   BMI 20.37 kg/m   Visual Acuity Right Eye Distance:   Left Eye Distance:   Bilateral Distance:    Right Eye Near:   Left Eye Near:    Bilateral Near:     Physical Exam  Constitutional: She appears well-developed and well-nourished.  Non-toxic appearance. She does not appear ill.  Very pleasant. Non toxic or ill appearing.   HENT:  Head: Normocephalic and atraumatic.  Right Ear: Hearing and tympanic membrane normal. No middle ear effusion.  Left Ear: Hearing and tympanic membrane normal.  No middle ear effusion.  Mouth/Throat: Uvula is midline and mucous membranes are normal. Posterior oropharyngeal erythema present. Tonsils are 2+ on the right. Tonsils are 2+ on the left. No tonsillar exudate.  Neck: Normal range of motion.    Cardiovascular: Normal rate, regular rhythm and normal heart sounds.  Pulmonary/Chest: Effort normal and breath sounds normal. No respiratory distress. She has no wheezes. She has no rhonchi. She has no rales.  Lymphadenopathy:    She has cervical adenopathy.  Neurological: She is alert.  Skin: Skin is warm and dry.  Psychiatric: She has a normal mood and affect.  Nursing note and vitals reviewed.    UC Treatments / Results  Labs (all labs ordered are listed, but only abnormal results are displayed) Labs Reviewed  CULTURE, GROUP A STREP [facility])  POCT RAPID STREP A    EKG None  Radiology No results found.  Procedures Procedures (including critical care time)  Medications Ordered in UC Medications  dexamethasone (DECADRON) injection 10 mg (has no administration in time range)    Initial Impression / Assessment and Plan / UC Course  I have reviewed the triage vital signs and the nursing notes.  Pertinent labs & imaging results that were available during my care of the patient were reviewed by me and considered in my medical decision making (see chart for details).     Rapid strep negative Is most likely viral pharyngitis We will treat with dexamethasone injection in clinic due to throat pain and swelling Sending throat swab for culture Follow up as needed for continued or worsening symptoms  Final Clinical Impressions(s) / UC Diagnoses   Final diagnoses:  Viral pharyngitis     Discharge Instructions     Your strep swab was negative.  We will send for culture Steroid injection given in clinic to help with pain,  inflammation and swelling of the throat Follow up as needed for continued or worsening symptoms     ED Prescriptions    None     Controlled Substance Prescriptions [place] Controlled Substance Registry consulted? Not Applicable   Janace Aris, NP 08/04/18 1013

## 2018-08-06 LAB — CULTURE, GROUP A STREP (THRC)

## 2018-09-21 IMAGING — CT CT ABD-PELV W/ CM
2 of 4 series · 16 of 46 positions shown, 18 images · IV contrast (ISOVUE)
Comparison: CT abdomen pelvis 05/04/2016, 11/07/2015

CLINICAL DATA: Abdominal pain

EXAM:
CT ABDOMEN AND PELVIS WITH CONTRAST
TECHNIQUE: Multidetector CT imaging of the abdomen and pelvis was performed
using the standard protocol following bolus administration of
intravenous contrast.
CONTRAST:  100mL Y59UZN-EGG IOPAMIDOL (Y59UZN-EGG) INJECTION 61%

[Series 2: axial st · axial · 0.68mm/px · z∈[-414,-40]mm · 13 of 85 slices shown, 15 images]
[im 5/85  soft-tissue]
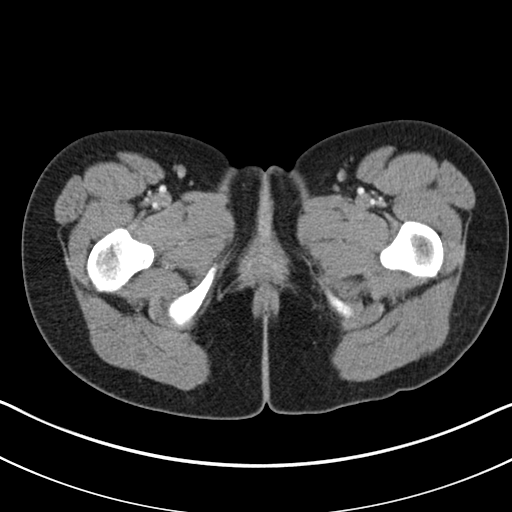
[im 5/85  bone]
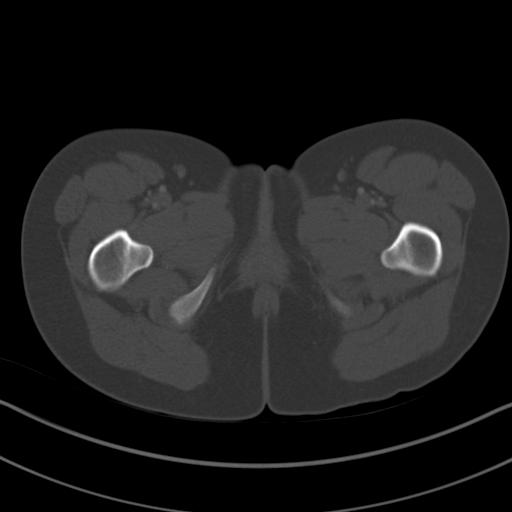
[im 10/85  soft-tissue]
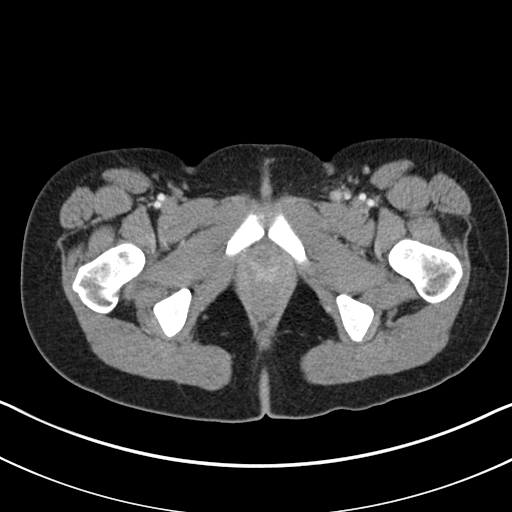
[im 20/85  soft-tissue]
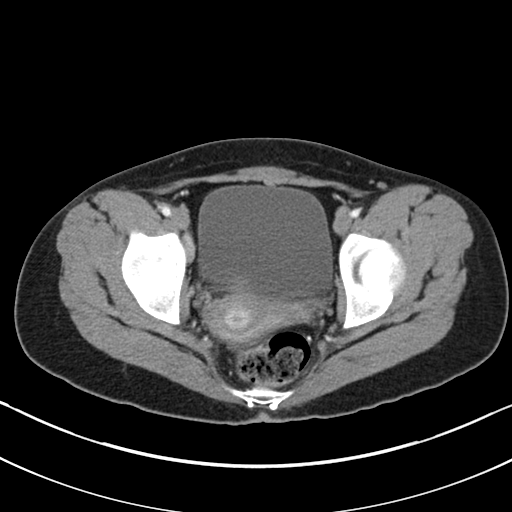
[im 25/85  soft-tissue]
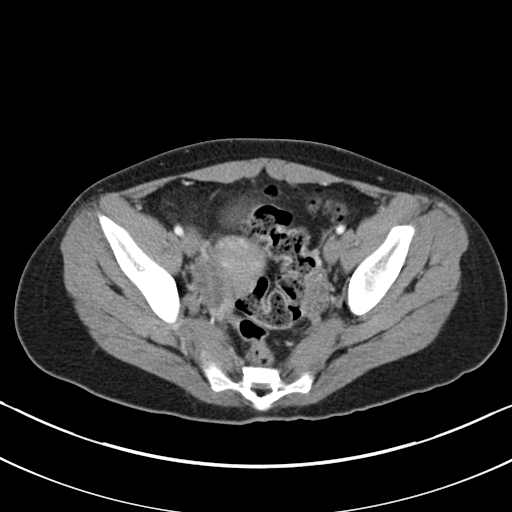
[im 30/85  soft-tissue]
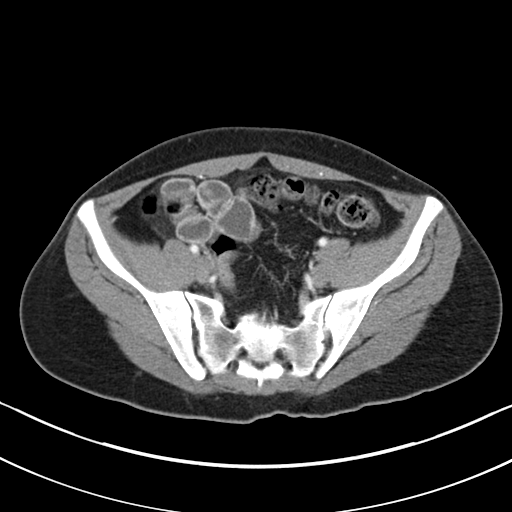
[im 35/85  soft-tissue]
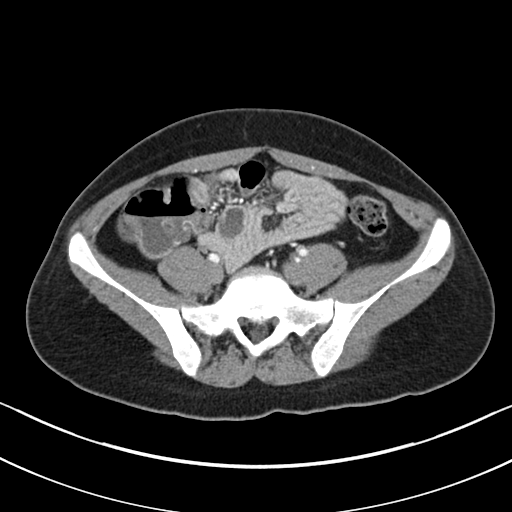
[im 45/85  soft-tissue]
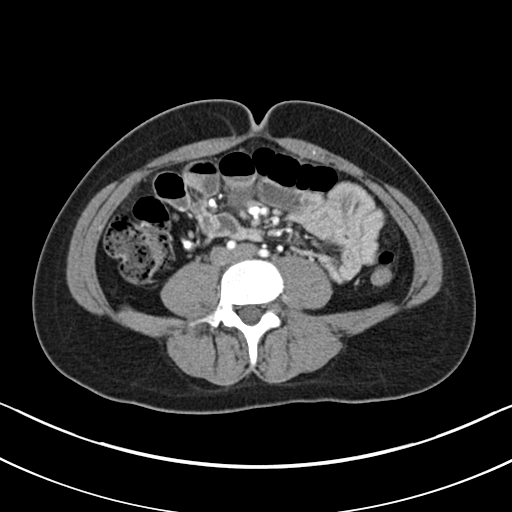
[im 50/85  soft-tissue]
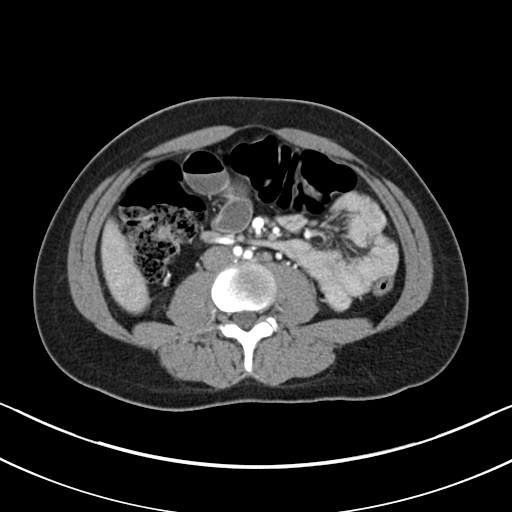
[im 55/85  soft-tissue]
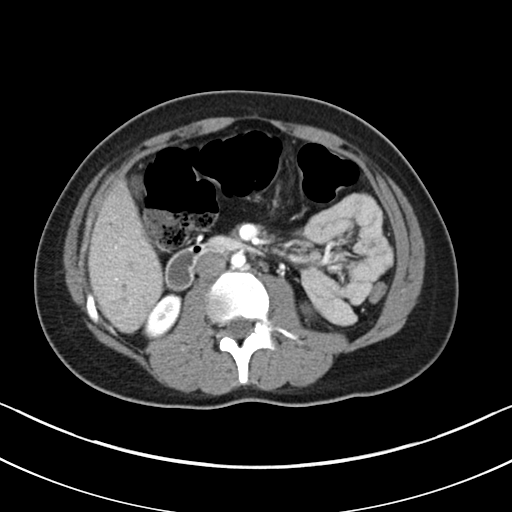
[im 55/85  bone]
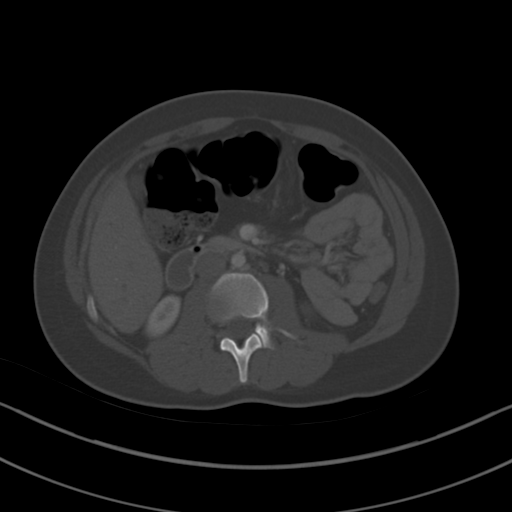
[im 60/85  soft-tissue]
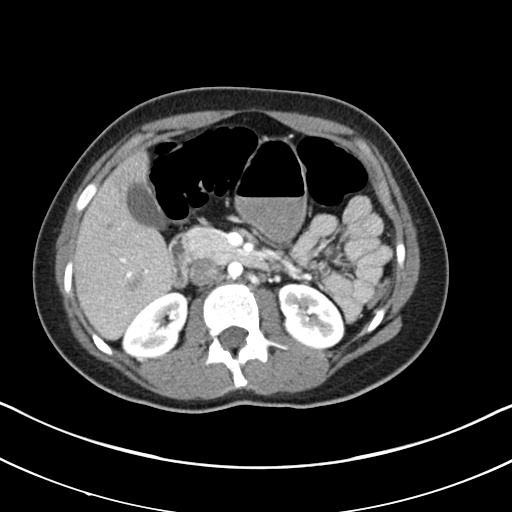
[im 65/85  soft-tissue]
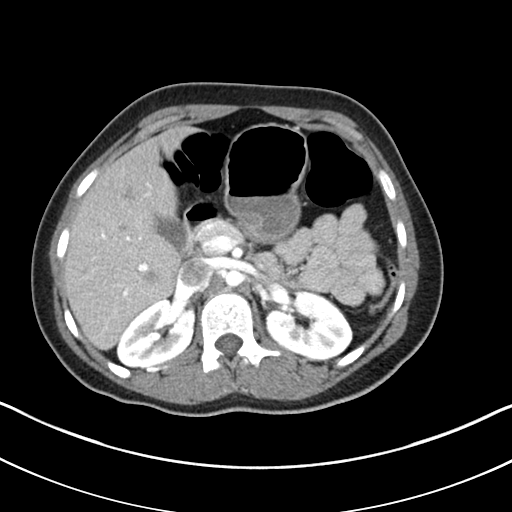
[im 75/85  soft-tissue]
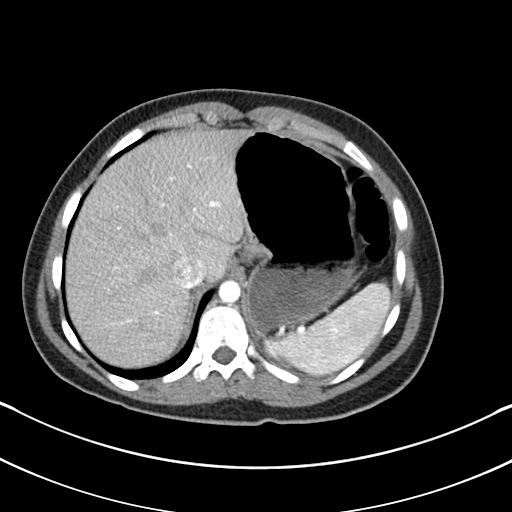
[im 80/85  soft-tissue]
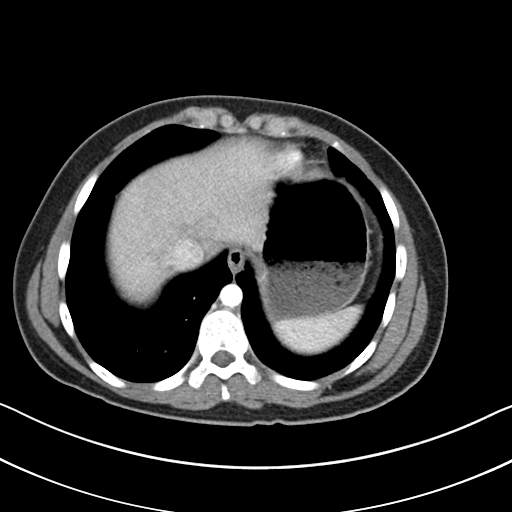

[Series 5: coronal st · coronal · 0.63mm/px · 3 of 78 slices shown]
[im 26/78  soft-tissue]
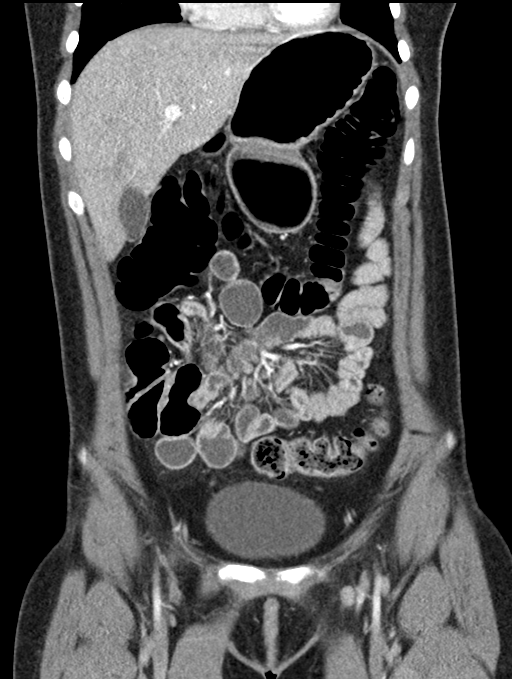
[im 35/78  soft-tissue]
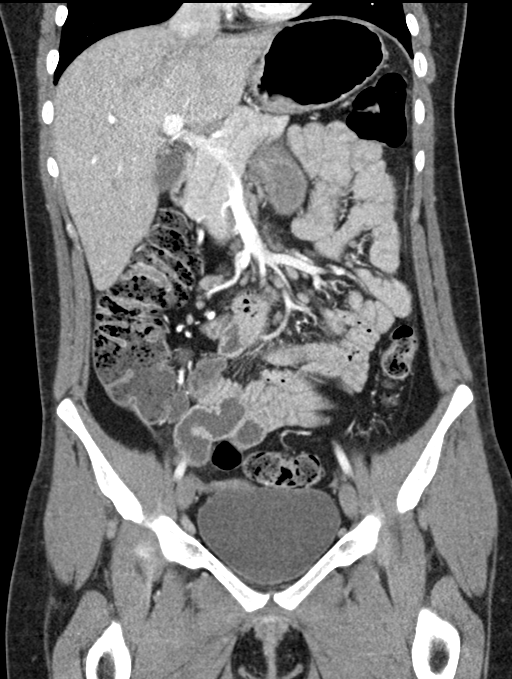
[im 43/78  soft-tissue]
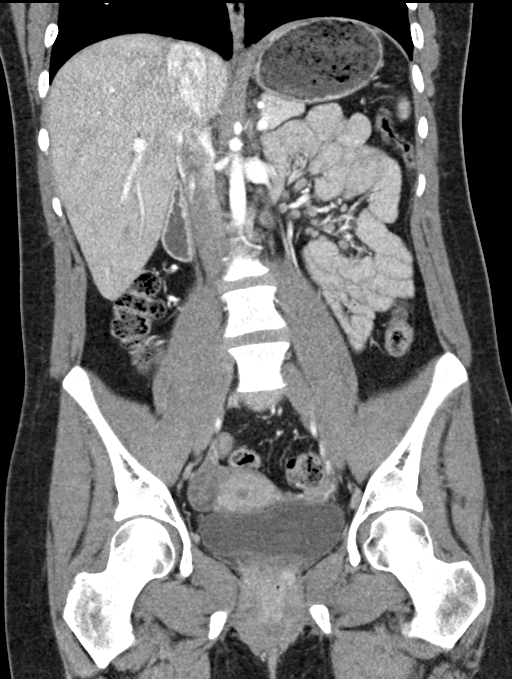

[16 of 46 positions shown; findings below may reference images not displayed]

FINDINGS: Lower chest: No acute abnormality.

Hepatobiliary: No focal liver abnormality is seen. No gallstones,
gallbladder wall thickening, or biliary dilatation.

Pancreas: Unremarkable. No pancreatic ductal dilatation or
surrounding inflammatory changes.

Spleen: Normal in size without focal abnormality.

Adrenals/Urinary Tract: Adrenal glands are unremarkable. Kidneys are
normal, without renal calculi, focal lesion, or hydronephrosis.
Bladder is unremarkable.

Stomach/Bowel: Stomach is within normal limits. Appendix not well
seen but no right lower quadrant inflammation is visualized. No
evidence of bowel wall thickening, distention, or inflammatory
changes. Prominent mucosal enhancement right lower quadrant small
bowel loops.

Vascular/Lymphatic: No significant vascular findings are present. No
enlarged abdominal or pelvic lymph nodes.

Reproductive: Uterus and bilateral adnexa are unremarkable.

Other: No abdominal wall hernia or abnormality. No abdominopelvic
ascites.

Musculoskeletal: No acute or significant osseous findings.
IMPRESSION: 1. Possible mild mucosal enhancement of right lower quadrant small
bowel loops, query mild enteritis of infectious or inflammatory
etiology
2. Otherwise no CT evidence for acute intra-abdominal or pelvic
abnormality

## 2019-05-11 ENCOUNTER — Other Ambulatory Visit: Payer: Self-pay

## 2019-05-11 ENCOUNTER — Emergency Department (HOSPITAL_COMMUNITY)
Admission: EM | Admit: 2019-05-11 | Discharge: 2019-05-11 | Disposition: A | Discharge status: Home / Self Care | Payer: Self-pay | Attending: Emergency Medicine | Admitting: Emergency Medicine

## 2019-05-11 ENCOUNTER — Encounter (HOSPITAL_COMMUNITY): Payer: Self-pay

## 2019-05-11 DIAGNOSIS — Z87891 Personal history of nicotine dependence: Secondary | ICD-10-CM | POA: Insufficient documentation

## 2019-05-11 DIAGNOSIS — N3000 Acute cystitis without hematuria: Secondary | ICD-10-CM | POA: Insufficient documentation

## 2019-05-11 LAB — URINALYSIS, ROUTINE W REFLEX MICROSCOPIC
Glucose, UA: NEGATIVE mg/dL
Ketones, ur: NEGATIVE mg/dL
Nitrite: NEGATIVE
Protein, ur: 30 mg/dL — AB
Specific Gravity, Urine: 1.025 (ref 1.005–1.030)
pH: 6.5 (ref 5.0–8.0)

## 2019-05-11 LAB — CBC WITH DIFFERENTIAL/PLATELET
Abs Immature Granulocytes: 0.02 10*3/uL (ref 0.00–0.07)
Basophils Absolute: 0 10*3/uL (ref 0.0–0.1)
Basophils Relative: 0 %
Eosinophils Absolute: 0 10*3/uL (ref 0.0–0.5)
Eosinophils Relative: 0 %
HCT: 41.8 % (ref 36.0–46.0)
Hemoglobin: 13.5 g/dL (ref 12.0–15.0)
Immature Granulocytes: 0 %
Lymphocytes Relative: 24 %
Lymphs Abs: 1.4 10*3/uL (ref 0.7–4.0)
MCH: 31.7 pg (ref 26.0–34.0)
MCHC: 32.3 g/dL (ref 30.0–36.0)
MCV: 98.1 fL (ref 80.0–100.0)
Monocytes Absolute: 0.4 10*3/uL (ref 0.1–1.0)
Monocytes Relative: 7 %
Neutro Abs: 3.9 10*3/uL (ref 1.7–7.7)
Neutrophils Relative %: 69 %
Platelets: 311 10*3/uL (ref 150–400)
RBC: 4.26 MIL/uL (ref 3.87–5.11)
RDW: 11.5 % (ref 11.5–15.5)
WBC: 5.7 10*3/uL (ref 4.0–10.5)
nRBC: 0 % (ref 0.0–0.2)

## 2019-05-11 LAB — COMPREHENSIVE METABOLIC PANEL
ALT: 16 U/L (ref 0–44)
AST: 17 U/L (ref 15–41)
Albumin: 4.5 g/dL (ref 3.5–5.0)
Alkaline Phosphatase: 66 U/L (ref 38–126)
Anion gap: 8 (ref 5–15)
BUN: 16 mg/dL (ref 6–20)
CO2: 25 mmol/L (ref 22–32)
Calcium: 9.3 mg/dL (ref 8.9–10.3)
Chloride: 106 mmol/L (ref 98–111)
Creatinine, Ser: 0.81 mg/dL (ref 0.44–1.00)
GFR calc Af Amer: >60 mL/min (ref >60)
GFR calc non Af Amer: >60 mL/min (ref >60)
Glucose, Bld: 113 mg/dL — ABNORMAL HIGH (ref 70–99)
Potassium: 3.8 mmol/L (ref 3.5–5.1)
Sodium: 139 mmol/L (ref 135–145)
Total Bilirubin: 1.1 mg/dL (ref 0.3–1.2)
Total Protein: 8.2 g/dL — ABNORMAL HIGH (ref 6.5–8.1)

## 2019-05-11 LAB — URINALYSIS, MICROSCOPIC (REFLEX)

## 2019-05-11 LAB — LIPASE, BLOOD: Lipase: 25 U/L (ref 11–51)

## 2019-05-11 MED ORDER — ONDANSETRON HCL 4 MG PO TABS: 4.0000 mg | ORAL_TABLET | Freq: Four times a day (QID) | ORAL | 0 refills | Status: DC

## 2019-05-11 MED ORDER — ONDANSETRON HCL 4 MG/2ML IJ SOLN
4.0000 mg | Freq: Once | INTRAMUSCULAR | Status: AC
  Administered 2019-05-11: 4 mg via INTRAVENOUS
  Filled 2019-05-11: qty 2

## 2019-05-11 MED ORDER — MORPHINE SULFATE (PF) 4 MG/ML IV SOLN
4.0000 mg | Freq: Once | INTRAVENOUS | Status: AC
  Administered 2019-05-11: 16:00:00 4 mg via INTRAVENOUS
  Filled 2019-05-11: qty 1

## 2019-05-11 MED ORDER — CEPHALEXIN 500 MG PO CAPS: 500.0000 mg | ORAL_CAPSULE | Freq: Four times a day (QID) | ORAL | 0 refills | Status: DC

## 2019-05-11 MED ORDER — SODIUM CHLORIDE 0.9 % IV SOLN
1.0000 g | Freq: Once | INTRAVENOUS | Status: AC
  Administered 2019-05-11: 1 g via INTRAVENOUS
  Filled 2019-05-11: qty 10

## 2019-05-11 MED ORDER — SODIUM CHLORIDE 0.9 % IV BOLUS
1000.0000 mL | Freq: Once | INTRAVENOUS | Status: AC
  Administered 2019-05-11: 16:00:00 1000 mL via INTRAVENOUS

## 2019-05-11 NOTE — ED Provider Notes (Signed)
[facility] DEPT Provider Note   CSN: [id number] Arrival date & time: 05/11/19  1351     History   Chief Complaint Chief Complaint  Patient presents with  . Dysuria  . Emesis  . Back Pain  . Abdominal Pain    HPI Christine Torres is a 23 y.o. female.     23 year old female with prior medical history as detailed below presents for evaluation of dysuria.  Patient reports increasing low back pain.  This started over the last 48 hours.  She reports associated dysuria and increased urinary frequency.  She denies fever.  She does report some nausea.  She reports that her symptoms today are consistent with an similar to prior episode of pyelonephritis.  Patient is unsure of her last period.  She is fairly sure that she is not pregnant.  She denies vaginal discharge.  She denies vaginal bleeding.  The history is provided by the patient and medical records.  Dysuria Pain quality:  Aching and sharp Pain severity:  Mild Onset quality:  Gradual Duration:  1 day Timing:  Constant Progression:  Worsening Chronicity:  Recurrent Recent urinary tract infections: no   Relieved by:  Nothing Worsened by:  Nothing Associated symptoms: abdominal pain and vomiting   Associated symptoms: no fever   Emesis Associated symptoms: abdominal pain   Associated symptoms: no fever   Back Pain Associated symptoms: abdominal pain and dysuria   Associated symptoms: no fever   Abdominal Pain Associated symptoms: dysuria and vomiting   Associated symptoms: no fever     Past Medical History:  Diagnosis Date  . Hip fracture [facility])     Patient Active Problem List   Diagnosis Date Noted  . Well child check 06/24/2013  . Amenorrhea 06/24/2013  . Screening 06/24/2013    History reviewed. No pertinent surgical history.   OB History   No obstetric history on file.      Home Medications    Prior to Admission medications   Medication Sig Start Date End Date  Taking? Authorizing Provider  dicyclomine (BENTYL) 20 MG tablet Take 1 tablet (20 mg total) by mouth 2 (two) times daily. Patient not taking: Reported on 05/11/2019 10/04/17   Larene Pickett, PA-C  ondansetron (ZOFRAN ODT) 4 MG disintegrating tablet Take 1 tablet (4 mg total) by mouth every 8 (eight) hours as needed for nausea. Patient not taking: Reported on 05/11/2019 10/04/17   Larene Pickett, PA-C    Family History Family History  Problem Relation Age of Onset  . Mental illness Father   . Arthritis Maternal Grandfather   . Hypertension Paternal Grandmother   . Diabetes Paternal Grandmother   . Alcohol abuse Paternal Grandfather   . Asthma Neg Hx   . Birth defects Neg Hx   . Cancer Neg Hx   . COPD Neg Hx   . Depression Neg Hx   . Drug abuse Neg Hx   . Early death Neg Hx   . Hearing loss Neg Hx   . Heart disease Neg Hx   . Hyperlipidemia Neg Hx   . Kidney disease Neg Hx   . Learning disabilities Neg Hx   . Mental retardation Neg Hx   . Miscarriages / Stillbirths Neg Hx   . Stroke Neg Hx   . Vision loss Neg Hx     Social History Social History   Tobacco Use  . Smoking status: Former Smoker    Types: Cigarettes  . Smokeless tobacco:  Never Used  Substance Use Topics  . Alcohol use: Yes    Comment: occ  . Drug use: No     Allergies   Patient has no known allergies.   Review of Systems Review of Systems  Constitutional: Negative for fever.  Gastrointestinal: Positive for abdominal pain and vomiting.  Genitourinary: Positive for dysuria.  Musculoskeletal: Positive for back pain.  All other systems reviewed and are negative.    Physical Exam Updated Vital Signs BP 121/84 (BP Location: Left Arm)   Pulse 73   Temp 98.2 F (36.8 C) (Oral)   Resp 16   Ht 5\' 2"  (1.575 m)   Wt 52.2 kg   SpO2 100%   BMI 21.03 kg/m   Physical Exam Vitals signs and nursing note reviewed.  Constitutional:      General: She is not in acute distress.    Appearance: She is  well-developed.  HENT:     Head: Normocephalic and atraumatic.  Eyes:     Conjunctiva/sclera: Conjunctivae normal.     Pupils: Pupils are equal, round, and reactive to light.  Neck:     Musculoskeletal: Normal range of motion and neck supple.  Cardiovascular:     Rate and Rhythm: Normal rate and regular rhythm.     Heart sounds: Normal heart sounds.  Pulmonary:     Effort: Pulmonary effort is normal. No respiratory distress.     Breath sounds: Normal breath sounds.  Abdominal:     General: There is no distension.     Palpations: Abdomen is soft.     Tenderness: There is no abdominal tenderness.  Genitourinary:    Comments: Mild left-sided CVA tenderness Musculoskeletal: Normal range of motion.        General: No deformity.  Skin:    General: Skin is warm and dry.  Neurological:     General: No focal deficit present.     Mental Status: She is alert and oriented to person, place, and time.      ED Treatments / Results  Labs (all labs ordered are listed, but only abnormal results are displayed) Labs Reviewed  URINALYSIS, ROUTINE W REFLEX MICROSCOPIC - Abnormal; Notable for the following components:      Result Value   APPearance CLOUDY (*)    Hgb urine dipstick LARGE (*)    Bilirubin Urine SMALL (*)    Protein, ur 30 (*)    Leukocytes,Ua TRACE (*)    All other components within normal limits  COMPREHENSIVE METABOLIC PANEL - Abnormal; Notable for the following components:   Glucose, Bld 113 (*)    Total Protein 8.2 (*)    All other components within normal limits  URINALYSIS, MICROSCOPIC (REFLEX) - Abnormal; Notable for the following components:   Bacteria, UA FEW (*)    All other components within normal limits  CBC WITH DIFFERENTIAL/PLATELET  LIPASE, BLOOD  I-STAT BETA HCG BLOOD, ED ([facility], [facility], [facility] ONLY)    EKG None  Radiology No results found.  Procedures Procedures (including critical care time)  Medications Ordered in ED Medications  morphine 4 MG/ML  injection 4 mg (4 mg Intravenous Given 05/11/19 1542)  ondansetron (ZOFRAN) injection 4 mg (4 mg Intravenous Given 05/11/19 1541)  sodium chloride 0.9 % bolus 1,000 mL (0 mLs Intravenous Stopped 05/11/19 1701)  cefTRIAXone (ROCEPHIN) 1 g in sodium chloride 0.9 % 100 mL IVPB (0 g Intravenous Stopped 05/11/19 1757)     Initial Impression / Assessment and Plan / ED Course  I have  reviewed the triage vital signs and the nursing notes.  Pertinent labs & imaging results that were available during my care of the patient were reviewed by me and considered in my medical decision making (see chart for details).        MDM  Screen complete  Christine Torres was evaluated in Emergency Department on 05/11/2019 for the symptoms described in the history of present illness. She was evaluated in the context of the global COVID-19 pandemic, which necessitated consideration that the patient might be at risk for infection with the SARS-CoV-2 virus that causes COVID-19. Institutional protocols and algorithms that pertain to the evaluation of patients at risk for COVID-19 are in a state of rapid change based on information released by regulatory bodies including the CDC and federal and state organizations. These policies and algorithms were followed during the patient's care in the ED.  Patient is presenting for evaluation of likely UTI and/or early pyelonephritis.   Labs support this with findings of infection on UA.  Her screening blood work appears to be without significant abnormality.  She does feel improved following treatment in the ED.  She does desire discharge home.    She appears to be appropriate for discharge.  Importance of close follow-up is stressed.  Strict return precautions given and understood.    Final Clinical Impressions(s) / ED Diagnoses   Final diagnoses:  Acute cystitis without hematuria    ED Discharge Orders         Ordered    cephALEXin (KEFLEX) 500 MG capsule  4 times daily      05/11/19 1754    ondansetron (ZOFRAN) 4 MG tablet  Every 6 hours     05/11/19 1754           Wynetta FinesMessick, Peter C, MD 05/11/19 1800

## 2019-05-11 NOTE — Discharge Instructions (Signed)
Please return for any problem.  Follow-up with your regular care provider as instructed. °

## 2019-05-11 NOTE — ED Notes (Signed)
Patient given water

## 2019-05-11 NOTE — ED Notes (Signed)
ED Provider at bedside. 

## 2019-05-11 NOTE — ED Triage Notes (Signed)
Patient c/o dysuria, lower abdominal pain that radiates into the lower back, emesis since this AM. Patient denies any vaginal discharge.

## 2020-05-04 ENCOUNTER — Emergency Department (HOSPITAL_COMMUNITY)
Admission: EM | Admit: 2020-05-04 | Discharge: 2020-05-04 | Disposition: A | Discharge status: Home / Self Care | Payer: Self-pay | Attending: Emergency Medicine | Admitting: Emergency Medicine

## 2020-05-04 ENCOUNTER — Other Ambulatory Visit: Payer: Self-pay

## 2020-05-04 ENCOUNTER — Encounter (HOSPITAL_COMMUNITY): Payer: Self-pay

## 2020-05-04 DIAGNOSIS — N898 Other specified noninflammatory disorders of vagina: Secondary | ICD-10-CM | POA: Insufficient documentation

## 2020-05-04 DIAGNOSIS — R1031 Right lower quadrant pain: Secondary | ICD-10-CM | POA: Insufficient documentation

## 2020-05-04 DIAGNOSIS — Z5321 Procedure and treatment not carried out due to patient leaving prior to being seen by health care provider: Secondary | ICD-10-CM | POA: Insufficient documentation

## 2020-05-04 DIAGNOSIS — N644 Mastodynia: Secondary | ICD-10-CM | POA: Insufficient documentation

## 2020-05-04 HISTORY — DX: Unspecified ovarian cyst, unspecified side: N83.209

## 2020-05-04 LAB — I-STAT BETA HCG BLOOD, ED (MC, WL, AP ONLY): I-stat hCG, quantitative: <5 m[IU]/mL (ref <5)

## 2020-05-04 NOTE — ED Triage Notes (Signed)
Patient c/o bilateral nipple pain and states her left breast is tender and larger than the right. Patient states she has small lumps on the left breast.  Patient also reports that she is having a "milky vaginal discharge" with a "fish odor." x 1 month ago.  Patient reports that she is having RLQ pain that woke her up out of her sleep.

## 2023-03-16 ENCOUNTER — Other Ambulatory Visit: Payer: Self-pay

## 2023-03-16 ENCOUNTER — Encounter (HOSPITAL_COMMUNITY): Payer: Self-pay | Admitting: Emergency Medicine

## 2023-03-16 ENCOUNTER — Ambulatory Visit (HOSPITAL_COMMUNITY)
Admission: EM | Admit: 2023-03-16 | Discharge: 2023-03-16 | Disposition: A | Discharge status: Home / Self Care | Payer: Self-pay | Attending: Physician Assistant | Admitting: Physician Assistant

## 2023-03-16 DIAGNOSIS — J40 Bronchitis, not specified as acute or chronic: Secondary | ICD-10-CM

## 2023-03-16 DIAGNOSIS — J329 Chronic sinusitis, unspecified: Secondary | ICD-10-CM

## 2023-03-16 MED ORDER — AMOXICILLIN-POT CLAVULANATE 875-125 MG PO TABS: 1.0000 | ORAL_TABLET | Freq: Two times a day (BID) | ORAL | 0 refills | Status: AC

## 2023-03-16 NOTE — ED Triage Notes (Signed)
Patient has a very congested cough, bilateral ears stuffiness, both ears are painful.  Symptoms x 1 week.  Patient has tried dayquil honey.  Patient has also tried alka seltzer cold and flu medicine with no relief.  Reports phlegm is green.

## 2023-03-16 NOTE — ED Provider Notes (Signed)
[facility]    CSN: [id number] Arrival date & time: 03/16/23  1059      History   Chief Complaint Chief Complaint  Patient presents with   Cough    HPI MIKHAILA ROH is a 27 y.o. female.   Patient presents today with a week plus long history of URI symptoms including congestion, cough, ear pressure.  Denies any fever, chest pain, shortness of breath, nausea, vomiting.  Does report that several household sick contacts of had similar symptoms.  She has had COVID with last episode within the past 90 days (about 2 months ago).  She has tried multiple over-the-counter medications including Alka-Seltzer cold and flu as well as DayQuil with minimal improvement of symptoms.  Denies any recent antibiotics or steroids.  She is confident that she is not pregnant.  Denies any history of allergies, asthma, COPD.  She does not smoke.  She is having difficulty with daily activities as a result of symptoms.    Past Medical History:  Diagnosis Date   Hip fracture [facility])    Ovarian cyst     Patient Active Problem List   Diagnosis Date Noted   Well child check 06/24/2013   Amenorrhea 06/24/2013   Screening 06/24/2013    History reviewed. No pertinent surgical history.  OB History   No obstetric history on file.      Home Medications    Prior to Admission medications   Medication Sig Start Date End Date Taking? Authorizing Provider  amoxicillin-clavulanate (AUGMENTIN) 875-125 MG tablet Take 1 tablet by mouth every 12 (twelve) hours. 03/16/23  Yes Teagon Kron, Noberto Retort, PA-C    Family History Family History  Problem Relation Age of Onset   Mental illness Father    Arthritis Maternal Grandfather    Hypertension Paternal Grandmother    Diabetes Paternal Grandmother    Alcohol abuse Paternal Grandfather    Asthma Neg Hx    Birth defects Neg Hx    Cancer Neg Hx    COPD Neg Hx    Depression Neg Hx    Drug abuse Neg Hx    Early death Neg Hx    Hearing loss Neg Hx    Heart  disease Neg Hx    Hyperlipidemia Neg Hx    Kidney disease Neg Hx    Learning disabilities Neg Hx    Mental retardation Neg Hx    Miscarriages / Stillbirths Neg Hx    Stroke Neg Hx    Vision loss Neg Hx     Social History Social History   Tobacco Use   Smoking status: Every Day    Types: Cigarettes   Smokeless tobacco: Never  Vaping Use   Vaping Use: Every day   Substances: Nicotine, Flavoring   Devices: Puff Bar  Substance Use Topics   Alcohol use: Yes    Comment: occ   Drug use: No     Allergies   Patient has no known allergies.   Review of Systems Review of Systems  Constitutional:  Positive for activity change. Negative for appetite change, fatigue and fever.  HENT:  Positive for congestion, postnasal drip, sinus pressure and sore throat. Negative for sneezing.   Respiratory:  Positive for cough. Negative for shortness of breath.   Cardiovascular:  Negative for chest pain.  Gastrointestinal:  Negative for abdominal pain, diarrhea, nausea and vomiting.  Musculoskeletal:  Negative for arthralgias and myalgias.  Neurological:  Positive for headaches. Negative for dizziness and light-headedness.  Physical Exam Triage Vital Signs ED Triage Vitals  Enc Vitals Group     BP 03/16/23 1336 115/70     Pulse Rate 03/16/23 1336 75     Resp 03/16/23 1336 18     Temp 03/16/23 1336 97.7 F (36.5 C)     Temp src --      SpO2 03/16/23 1336 97 %     Weight --      Height --      Head Circumference --      Peak Flow --      Pain Score 03/16/23 1332 7     Pain Loc --      Pain Edu? --      Excl. in GC? --    No data found.  Updated Vital Signs BP 115/70 (BP Location: Right Arm)   Pulse 75   Temp 97.7 F (36.5 C)   Resp 18   SpO2 97%   Visual Acuity Right Eye Distance:   Left Eye Distance:   Bilateral Distance:    Right Eye Near:   Left Eye Near:    Bilateral Near:     Physical Exam Vitals reviewed.  Constitutional:      General: She is awake. She  is not in acute distress.    Appearance: Normal appearance. She is well-developed. She is not ill-appearing.     Comments: Very pleasant female appears stated age in no acute distress sitting comfortably in exam room  HENT:     Head: Normocephalic and atraumatic.     Right Ear: Tympanic membrane, ear canal and external ear normal. Tympanic membrane is not erythematous or bulging.     Left Ear: Tympanic membrane, ear canal and external ear normal. Tympanic membrane is not erythematous or bulging.     Nose:     Right Sinus: Maxillary sinus tenderness and frontal sinus tenderness present.     Left Sinus: Maxillary sinus tenderness and frontal sinus tenderness present.     Mouth/Throat:     Pharynx: Uvula midline. Posterior oropharyngeal erythema present. No oropharyngeal exudate.     Comments: Erythema and drainage in posterior oropharynx Cardiovascular:     Rate and Rhythm: Normal rate and regular rhythm.     Heart sounds: Normal heart sounds, S1 normal and S2 normal. No murmur heard. Pulmonary:     Effort: Pulmonary effort is normal.     Breath sounds: Normal breath sounds. No wheezing, rhonchi or rales.     Comments: Clear to auscultation bilaterally Psychiatric:        Behavior: Behavior is cooperative.      UC Treatments / Results  Labs (all labs ordered are listed, but only abnormal results are displayed) Labs Reviewed - No data to display  EKG   Radiology No results found.  Procedures Procedures (including critical care time)  Medications Ordered in UC Medications - No data to display  Initial Impression / Assessment and Plan / UC Course  I have reviewed the triage vital signs and the nursing notes.  Pertinent labs & imaging results that were available during my care of the patient were reviewed by me and considered in my medical decision making (see chart for details).     Patient is well-appearing, afebrile, nontoxic, nontachycardic.  Viral testing was deferred  as patient has already been symptomatic for over a week and this would not change management.  Chest x-ray was deferred as there were no adventitious lung sounds on exam and her oxygen  saturation was 97%.  Given prolonged and worsening symptoms will cover with Augmentin for secondary bacterial infection.  We did discuss potential utility of prednisone burst but after discussion of potential side effects patient elected not to use this medication for the time being.  She can use over-the-counter medication including Mucinex, Flonase, Tylenol, ibuprofen.  Recommend that she rest and drink plenty of fluid.  We discussed that if her symptoms are improving within a week she should return for reevaluation.  If she has any worsening symptoms she is to be seen immediately and should return precautions were given.  Work excuse note provided.  Final Clinical Impressions(s) / UC Diagnoses   Final diagnoses:  Sinobronchitis     Discharge Instructions      Start Augmentin twice daily for 7 days.  Use over-the-counter medication including Mucinex, Flonase, Tylenol.  Gargle with warm salt water for sore throat and congestion symptoms.  I also recommend nasal saline and sinus rinses.  Make sure that you are drinking plenty of fluid.  If your symptoms are not improving within a week please return for reevaluation.  If you have any worsening symptoms including chest pain, shortness of breath, fever you should be seen immediately.     ED Prescriptions     Medication Sig Dispense Auth. Provider   amoxicillin-clavulanate (AUGMENTIN) 875-125 MG tablet Take 1 tablet by mouth every 12 (twelve) hours. 14 tablet Maysel Mccolm, Noberto Retort, PA-C      PDMP not reviewed this encounter.   Jeani Hawking, PA-C 03/16/23 1423

## 2023-03-16 NOTE — Discharge Instructions (Signed)
Start Augmentin twice daily for 7 days.  Use over-the-counter medication including Mucinex, Flonase, Tylenol.  Gargle with warm salt water for sore throat and congestion symptoms.  I also recommend nasal saline and sinus rinses.  Make sure that you are drinking plenty of fluid.  If your symptoms are not improving within a week please return for reevaluation.  If you have any worsening symptoms including chest pain, shortness of breath, fever you should be seen immediately.

## 2023-04-20 ENCOUNTER — Encounter (HOSPITAL_COMMUNITY): Payer: Self-pay

## 2023-04-20 ENCOUNTER — Ambulatory Visit (HOSPITAL_COMMUNITY)
Admission: EM | Admit: 2023-04-20 | Discharge: 2023-04-20 | Disposition: A | Discharge status: Home / Self Care | Payer: Self-pay | Attending: Emergency Medicine | Admitting: Emergency Medicine

## 2023-04-20 DIAGNOSIS — L237 Allergic contact dermatitis due to plants, except food: Secondary | ICD-10-CM

## 2023-04-20 MED ORDER — METHYLPREDNISOLONE ACETATE 80 MG/ML IJ SUSP
60.0000 mg | Freq: Once | INTRAMUSCULAR | Status: AC
  Administered 2023-04-20: 60 mg via INTRAMUSCULAR

## 2023-04-20 MED ORDER — METHYLPREDNISOLONE ACETATE 80 MG/ML IJ SUSP
INTRAMUSCULAR | Status: AC
  Filled 2023-04-20: qty 1

## 2023-04-20 MED ORDER — PREDNISONE 10 MG (21) PO TBPK: ORAL_TABLET | Freq: Every day | ORAL | 0 refills | Status: AC

## 2023-04-20 NOTE — Discharge Instructions (Addendum)
Today you are being treated for the poison ivy/oak rash  You have been given an injection of steroids today in the office today to help reduce the inflammatory process that occurs with this rash which will help minimize your itching as well as begin to clear  Starting tomorrow take prednisone every morning with food as directed, to continue the above process  You may continue use of topical calamine or Benadryl cream to help manage itching, you may also continue oral Benadryl  Please avoid long exposures to heat such as a hot steamy shower or being outside as this may cause further irritation to your rash  You may follow-up with his urgent care as needed if symptoms persist or worsen 

## 2023-04-20 NOTE — ED Triage Notes (Signed)
Patient here today with c/o itchy rash on face, neck, shoulder, hands, and ankle X 2 days after coming in contact with poison ivy.

## 2023-04-20 NOTE — ED Provider Notes (Signed)
[facility]    CSN: [id number] Arrival date & time: 04/20/23  1624      History   Chief Complaint Chief Complaint  Patient presents with   Rash    HPI Christine Torres is a 27 y.o. female.   Patient presents for evaluation of erythematous pruritic rash generalized to the body beginning 2 days ago after exposure to poison ivy.  Initially began to the front of the neck but has spread to the cheeks and the forehead to the bilateral extremities.  Has itchiness in the throat but denies difficulty swallowing, shortness of breath or wheezing.  Has attempted use of topical Benadryl which has been ineffective.  Past Medical History:  Diagnosis Date   Hip fracture [facility])    Ovarian cyst     Patient Active Problem List   Diagnosis Date Noted   Well child check 06/24/2013   Amenorrhea 06/24/2013   Screening 06/24/2013    History reviewed. No pertinent surgical history.  OB History   No obstetric history on file.      Home Medications    Prior to Admission medications   Medication Sig Start Date End Date Taking? Authorizing Provider  predniSONE (STERAPRED UNI-PAK 21 TAB) 10 MG (21) TBPK tablet Take by mouth daily. Take 6 tabs by mouth daily  for 2 days, then 5 tabs for 2 days, then 4 tabs for 2 days, then 3 tabs for 2 days, 2 tabs for 2 days, then 1 tab by mouth daily for 2 days 04/20/23  Yes Byrdie Miyazaki R, NP  amoxicillin-clavulanate (AUGMENTIN) 875-125 MG tablet Take 1 tablet by mouth every 12 (twelve) hours. 03/16/23   Raspet, Noberto Retort, PA-C    Family History Family History  Problem Relation Age of Onset   Mental illness Father    Arthritis Maternal Grandfather    Hypertension Paternal Grandmother    Diabetes Paternal Grandmother    Alcohol abuse Paternal Grandfather    Asthma Neg Hx    Birth defects Neg Hx    Cancer Neg Hx    COPD Neg Hx    Depression Neg Hx    Drug abuse Neg Hx    Early death Neg Hx    Hearing loss Neg Hx    Heart disease Neg Hx     Hyperlipidemia Neg Hx    Kidney disease Neg Hx    Learning disabilities Neg Hx    Mental retardation Neg Hx    Miscarriages / Stillbirths Neg Hx    Stroke Neg Hx    Vision loss Neg Hx     Social History Social History   Tobacco Use   Smoking status: Former    Types: Cigarettes   Smokeless tobacco: Never  Vaping Use   Vaping status: Former   Devices: Data processing manager  Substance Use Topics   Alcohol use: Not Currently    Comment: occ   Drug use: No     Allergies   Patient has no known allergies.   Review of Systems Review of Systems  Skin:  Positive for rash.     Physical Exam Triage Vital Signs ED Triage Vitals  Encounter Vitals Group     BP 04/20/23 1655 123/82     Systolic BP Percentile --      Diastolic BP Percentile --      Pulse Rate 04/20/23 1655 64     Resp 04/20/23 1655 16     Temp 04/20/23 1655 98.3 F (36.8 C)  Temp Source 04/20/23 1655 Oral     SpO2 04/20/23 1655 97 %     Weight 04/20/23 1655 133 lb (60.3 kg)     Height 04/20/23 1655 5\' 3"  (1.6 m)     Head Circumference --      Peak Flow --      Pain Score 04/20/23 1654 7     Pain Loc --      Pain Education --      Exclude from Growth Chart --    No data found.  Updated Vital Signs BP 123/82 (BP Location: Right Arm)   Pulse 64   Temp 98.3 F (36.8 C) (Oral)   Resp 16   Ht 5\' 3"  (1.6 m)   Wt 133 lb (60.3 kg)   LMP 10/21/2022 (Approximate)   SpO2 97%   BMI 23.56 kg/m   Visual Acuity Right Eye Distance:   Left Eye Distance:   Bilateral Distance:    Right Eye Near:   Left Eye Near:    Bilateral Near:     Physical Exam Constitutional:      Appearance: Normal appearance.  Eyes:     Extraocular Movements: Extraocular movements intact.  Pulmonary:     Effort: Pulmonary effort is normal.  Skin:    Comments: Erythematous maculopapular rash present to the upper extremities, neck and face without involvement of the eyes   Neurological:     Mental Status: She is alert and oriented to  person, place, and time. Mental status is at baseline.      UC Treatments / Results  Labs (all labs ordered are listed, but only abnormal results are displayed) Labs Reviewed - No data to display  EKG   Radiology No results found.  Procedures Procedures (including critical care time)  Medications Ordered in UC Medications  methylPREDNISolone acetate (DEPO-MEDROL) injection 60 mg (60 mg Intramuscular Given 04/20/23 1710)    Initial Impression / Assessment and Plan / UC Course  I have reviewed the triage vital signs and the nursing notes.  Pertinent labs & imaging results that were available during my care of the patient were reviewed by me and considered in my medical decision making (see chart for details).  Poison ivy dermatitis  Known exposure, methylprednisolone injection given in office, no current signs of respiratory distress, O2 saturation 97% on room air, prednisone taper prescribed for outpatient use, recommended to continue use of topical antihistamines, may also attempt use of oral, advised against long exposure to heat and recommended follow-up if symptoms persist or worsen Final Clinical Impressions(s) / UC Diagnoses   Final diagnoses:  Poison ivy dermatitis     Discharge Instructions      Today you are being treated for the poison ivy/oak rash  You have been given an injection of steroids today in the office today to help reduce the inflammatory process that occurs with this rash which will help minimize your itching as well as begin to clear  Starting tomorrow take prednisone every morning with food as directed, to continue the above process  You may continue use of topical calamine or Benadryl cream to help manage itching, you may also continue oral Benadryl  Please avoid long exposures to heat such as a hot steamy shower or being outside as this may cause further irritation to your rash  You may follow-up with his urgent care as needed if symptoms  persist or worsen    ED Prescriptions     Medication Sig Dispense Auth. Provider  predniSONE (STERAPRED UNI-PAK 21 TAB) 10 MG (21) TBPK tablet Take by mouth daily. Take 6 tabs by mouth daily  for 2 days, then 5 tabs for 2 days, then 4 tabs for 2 days, then 3 tabs for 2 days, 2 tabs for 2 days, then 1 tab by mouth daily for 2 days 42 tablet Isaiah Torok, Elita Boone, NP      PDMP not reviewed this encounter.   Valinda Hoar, NP 04/20/23 [phone]
